# Patient Record
Sex: Male | Born: 1950 | Race: White | Hispanic: No | Smoking: Former smoker
Health system: Southern US, Community
[De-identification: ages and names within clinical notes are randomized; demographics above are authoritative.]

## PROBLEM LIST (undated history)

## (undated) DIAGNOSIS — Z8719 Personal history of other diseases of the digestive system: Secondary | ICD-10-CM

## (undated) DIAGNOSIS — C61 Malignant neoplasm of prostate: Secondary | ICD-10-CM

## (undated) DIAGNOSIS — M1990 Unspecified osteoarthritis, unspecified site: Secondary | ICD-10-CM

## (undated) HISTORY — PX: APPENDECTOMY: SHX54

## (undated) HISTORY — PX: HERNIA REPAIR: SHX51

---

## 2003-08-05 ENCOUNTER — Ambulatory Visit (HOSPITAL_COMMUNITY): Admission: RE | Admit: 2003-08-05 | Discharge: 2003-08-05 | Payer: Self-pay | Admitting: Internal Medicine

## 2003-08-12 ENCOUNTER — Ambulatory Visit (HOSPITAL_COMMUNITY): Admission: RE | Admit: 2003-08-12 | Discharge: 2003-08-12 | Payer: Self-pay | Admitting: Internal Medicine

## 2004-09-21 ENCOUNTER — Ambulatory Visit (HOSPITAL_COMMUNITY): Admission: RE | Admit: 2004-09-21 | Discharge: 2004-09-21 | Payer: Self-pay | Admitting: Internal Medicine

## 2004-11-13 ENCOUNTER — Ambulatory Visit (HOSPITAL_COMMUNITY): Admission: RE | Admit: 2004-11-13 | Discharge: 2004-11-13 | Payer: Self-pay | Admitting: General Surgery

## 2014-01-04 ENCOUNTER — Other Ambulatory Visit (HOSPITAL_COMMUNITY): Payer: Self-pay | Admitting: Surgery

## 2014-01-04 DIAGNOSIS — L0291 Cutaneous abscess, unspecified: Secondary | ICD-10-CM

## 2014-01-05 ENCOUNTER — Ambulatory Visit (HOSPITAL_COMMUNITY)
Admission: RE | Admit: 2014-01-05 | Discharge: 2014-01-05 | Disposition: A | Discharge status: Home / Self Care | Payer: Medicare HMO | Source: Ambulatory Visit | Attending: Interventional Radiology | Admitting: Interventional Radiology

## 2014-01-05 ENCOUNTER — Encounter (HOSPITAL_COMMUNITY): Payer: Self-pay

## 2014-01-05 ENCOUNTER — Ambulatory Visit (HOSPITAL_COMMUNITY)
Admission: RE | Admit: 2014-01-05 | Discharge: 2014-01-05 | Disposition: A | Discharge status: Home / Self Care | Payer: Medicare HMO | Source: Ambulatory Visit | Attending: Surgery | Admitting: Surgery

## 2014-01-05 DIAGNOSIS — R109 Unspecified abdominal pain: Secondary | ICD-10-CM | POA: Diagnosis present

## 2014-01-05 DIAGNOSIS — L02211 Cutaneous abscess of abdominal wall: Secondary | ICD-10-CM | POA: Diagnosis present

## 2014-01-05 DIAGNOSIS — L0291 Cutaneous abscess, unspecified: Secondary | ICD-10-CM

## 2014-01-05 DIAGNOSIS — Z87891 Personal history of nicotine dependence: Secondary | ICD-10-CM | POA: Insufficient documentation

## 2014-01-05 DIAGNOSIS — K651 Peritoneal abscess: Secondary | ICD-10-CM | POA: Diagnosis present

## 2014-01-05 DIAGNOSIS — Z9049 Acquired absence of other specified parts of digestive tract: Secondary | ICD-10-CM | POA: Diagnosis not present

## 2014-01-05 LAB — PROTIME-INR
INR: 1.37 (ref 0.00–1.49)
PROTHROMBIN TIME: 17.1 s — AB (ref 11.6–15.2)

## 2014-01-05 MED ORDER — MIDAZOLAM HCL 2 MG/2ML IJ SOLN
INTRAMUSCULAR | Status: AC | PRN
  Administered 2014-01-05 (×2): 1 mg via INTRAVENOUS

## 2014-01-05 MED ORDER — FENTANYL CITRATE 0.05 MG/ML IJ SOLN
INTRAMUSCULAR | Status: AC
  Filled 2014-01-05: qty 2

## 2014-01-05 MED ORDER — MIDAZOLAM HCL 2 MG/2ML IJ SOLN
INTRAMUSCULAR | Status: AC
  Filled 2014-01-05: qty 2

## 2014-01-05 MED ORDER — LIDOCAINE HCL 1 % IJ SOLN
INTRAMUSCULAR | Status: AC
  Filled 2014-01-05: qty 20

## 2014-01-05 MED ORDER — FENTANYL CITRATE 0.05 MG/ML IJ SOLN
INTRAMUSCULAR | Status: AC | PRN
  Administered 2014-01-05 (×2): 50 ug via INTRAVENOUS

## 2014-01-05 NOTE — Sedation Documentation (Signed)
Patient denies pain and is resting comfortably.  

## 2014-01-05 NOTE — Procedures (Signed)
Interventional Radiology Procedure Note  Procedure: CT guided drainage of right abdominal abscess.  24F drain placed to bulb suction. 125cc purulent fluid aspirated.   Complications: No immediate Recommendations:  - f/u cultures - Routine drain care. - flush with sterile saline BID - record output   Signed,  Dulcy Fanny. Earleen Newport, DO

## 2014-01-05 NOTE — H&P (Signed)
Chief Complaint: abd pain Recent appendectomy 12/28/2013 New abd and pelvic abscesses  Referring Physician(s): Demason,Marc  History of Present Illness: Michael Contreras is a 63 y.o. male   Pt developed N/V and abd pain approx 10 days ago Admitted to [facility] CT scan revealed acute appendicitis with perforation Appendectomy 12/28/13 Now with new abd pain CT reveals RLQ abscess and pelvic abscess Request for IR consult for RLQ abscess and pelvic abscess drain placement Dr Earleen Newport has reviewed imaging I have seen and examined pt Now scheduled for abscess drains  History reviewed. No pertinent past medical history.  Past Surgical History  Procedure Laterality Date  . Appendectomy      Allergies: Review of patient's allergies indicates no known allergies.  Medications: Prior to Admission medications   Not on File    History reviewed. No pertinent family history.  History   Social History  . Marital Status: Legally Separated    Spouse Name: N/A    Number of Children: N/A  . Years of Education: N/A   Social History Main Topics  . Smoking status: Former Smoker    Quit date: 01/05/2013  . Smokeless tobacco: None  . Alcohol Use: None  . Drug Use: None  . Sexual Activity: None   Other Topics Concern  . None   Social History Narrative  . None     Review of Systems: A 12 point ROS discussed and pertinent positives are indicated in the HPI above.  All other systems are negative.  Review of Systems  Constitutional: Positive for fever, activity change and appetite change.  Respiratory: Negative for shortness of breath.   Cardiovascular: Negative for chest pain.  Gastrointestinal: Positive for abdominal pain and abdominal distention.  Psychiatric/Behavioral: Negative for behavioral problems and confusion.    Vital Signs: BP 114/60 mmHg  Pulse 94  Temp(Src) 98.5 F (36.9 C) (Oral)  Resp 18  SpO2 91%  Physical Exam  Cardiovascular: Normal  rate, regular rhythm and normal heart sounds.   Pulmonary/Chest: Effort normal and breath sounds normal. He has no wheezes.  Abdominal: Soft. Bowel sounds are normal. He exhibits distension. There is tenderness.  Musculoskeletal: Normal range of motion.  Neurological: He is alert.  Skin: Skin is warm and dry.  Psychiatric: He has a normal mood and affect. His behavior is normal. Judgment and thought content normal.  Vitals reviewed.   Imaging: No results found.  Labs:  CBC: No results for input(s): WBC, HGB, HCT, PLT in the last 8760 hours.  COAGS:  Recent Labs  01/05/14 1000  INR 1.37    BMP: No results for input(s): NA, K, CL, CO2, GLUCOSE, BUN, CALCIUM, CREATININE, GFRNONAA, GFRAA in the last 8760 hours.  Invalid input(s): CMP  LIVER FUNCTION TESTS: No results for input(s): BILITOT, AST, ALT, ALKPHOS, PROT, ALBUMIN in the last 8760 hours.  TUMOR MARKERS: No results for input(s): AFPTM, CEA, CA199, CHROMGRNA in the last 8760 hours.  Assessment and Plan:  11/3 appendectomy after acute appendicitis with perforation 12/28/13 Now with new abd pain; fever New RLQ and pelvic abscesses Scheduled now for abscess drains to be placed Pt aware of procedure benefits and risks and agreeable to proceed Consent signed and in chart  Thank you for this interesting consult.  I greatly enjoyed meeting Michael Contreras and look forward to participating in their care.     I spent a total of 40 minutes face to face in clinical consultation, greater than 50% of which was counseling/coordinating care  for abd and pelvic abscess drains  Signed: Elisheba Mcdonnell A 01/05/2014, 11:04 AM

## 2014-01-05 NOTE — Sedation Documentation (Signed)
[facility].

## 2014-01-05 NOTE — Procedures (Signed)
Interventional Radiology Procedure Note  Procedure:  CT guided pelvic abscess drainage. 44F drainage.  Findings: Abscess drained via 44F transgluteal drain.   Complications: No immediate Recommendations:  - Flush drain 2 times per day, 72ml sterile saline. - record output - follow up cultures  Signed,  Dulcy Fanny. Earleen Newport, DO

## 2014-01-08 LAB — CULTURE, ROUTINE-ABSCESS

## 2014-01-10 LAB — ANAEROBIC CULTURE

## 2014-01-11 LAB — CULTURE, ROUTINE-ABSCESS

## 2015-06-12 DIAGNOSIS — Z6822 Body mass index (BMI) 22.0-22.9, adult: Secondary | ICD-10-CM | POA: Diagnosis not present

## 2015-06-12 DIAGNOSIS — L42 Pityriasis rosea: Secondary | ICD-10-CM | POA: Diagnosis not present

## 2015-06-14 DIAGNOSIS — Z79899 Other long term (current) drug therapy: Secondary | ICD-10-CM | POA: Diagnosis not present

## 2015-06-14 DIAGNOSIS — Z131 Encounter for screening for diabetes mellitus: Secondary | ICD-10-CM | POA: Diagnosis not present

## 2015-11-02 DIAGNOSIS — K4090 Unilateral inguinal hernia, without obstruction or gangrene, not specified as recurrent: Secondary | ICD-10-CM | POA: Diagnosis not present

## 2015-11-03 DIAGNOSIS — K4090 Unilateral inguinal hernia, without obstruction or gangrene, not specified as recurrent: Secondary | ICD-10-CM | POA: Diagnosis not present

## 2015-11-03 DIAGNOSIS — R339 Retention of urine, unspecified: Secondary | ICD-10-CM | POA: Diagnosis not present

## 2015-11-03 DIAGNOSIS — N9989 Other postprocedural complications and disorders of genitourinary system: Secondary | ICD-10-CM | POA: Diagnosis not present

## 2015-11-03 DIAGNOSIS — R338 Other retention of urine: Secondary | ICD-10-CM | POA: Diagnosis not present

## 2015-11-03 DIAGNOSIS — Z87891 Personal history of nicotine dependence: Secondary | ICD-10-CM | POA: Diagnosis not present

## 2015-11-03 DIAGNOSIS — Z7982 Long term (current) use of aspirin: Secondary | ICD-10-CM | POA: Diagnosis not present

## 2015-11-03 DIAGNOSIS — D176 Benign lipomatous neoplasm of spermatic cord: Secondary | ICD-10-CM | POA: Diagnosis not present

## 2015-11-06 DIAGNOSIS — R339 Retention of urine, unspecified: Secondary | ICD-10-CM | POA: Diagnosis not present

## 2015-11-06 DIAGNOSIS — K4090 Unilateral inguinal hernia, without obstruction or gangrene, not specified as recurrent: Secondary | ICD-10-CM | POA: Diagnosis not present

## 2015-11-14 DIAGNOSIS — R339 Retention of urine, unspecified: Secondary | ICD-10-CM | POA: Diagnosis not present

## 2015-11-14 DIAGNOSIS — R39198 Other difficulties with micturition: Secondary | ICD-10-CM | POA: Diagnosis not present

## 2015-12-18 DIAGNOSIS — R339 Retention of urine, unspecified: Secondary | ICD-10-CM | POA: Diagnosis not present

## 2015-12-18 DIAGNOSIS — R39198 Other difficulties with micturition: Secondary | ICD-10-CM | POA: Diagnosis not present

## 2015-12-26 DIAGNOSIS — R972 Elevated prostate specific antigen [PSA]: Secondary | ICD-10-CM | POA: Diagnosis not present

## 2015-12-26 DIAGNOSIS — N5202 Corporo-venous occlusive erectile dysfunction: Secondary | ICD-10-CM | POA: Diagnosis not present

## 2015-12-26 DIAGNOSIS — R39198 Other difficulties with micturition: Secondary | ICD-10-CM | POA: Diagnosis not present

## 2016-01-31 DIAGNOSIS — R972 Elevated prostate specific antigen [PSA]: Secondary | ICD-10-CM | POA: Diagnosis not present

## 2016-02-06 DIAGNOSIS — R972 Elevated prostate specific antigen [PSA]: Secondary | ICD-10-CM | POA: Diagnosis not present

## 2016-02-06 DIAGNOSIS — R39198 Other difficulties with micturition: Secondary | ICD-10-CM | POA: Diagnosis not present

## 2016-02-06 DIAGNOSIS — N5202 Corporo-venous occlusive erectile dysfunction: Secondary | ICD-10-CM | POA: Diagnosis not present

## 2016-02-15 DIAGNOSIS — Z6824 Body mass index (BMI) 24.0-24.9, adult: Secondary | ICD-10-CM | POA: Diagnosis not present

## 2016-02-15 DIAGNOSIS — K429 Umbilical hernia without obstruction or gangrene: Secondary | ICD-10-CM | POA: Diagnosis not present

## 2016-03-15 DIAGNOSIS — R972 Elevated prostate specific antigen [PSA]: Secondary | ICD-10-CM | POA: Diagnosis not present

## 2016-03-15 DIAGNOSIS — C61 Malignant neoplasm of prostate: Secondary | ICD-10-CM | POA: Diagnosis not present

## 2016-03-28 DIAGNOSIS — C61 Malignant neoplasm of prostate: Secondary | ICD-10-CM | POA: Diagnosis not present

## 2016-04-23 DIAGNOSIS — Z6824 Body mass index (BMI) 24.0-24.9, adult: Secondary | ICD-10-CM | POA: Diagnosis not present

## 2016-04-23 DIAGNOSIS — M13141 Monoarthritis, not elsewhere classified, right hand: Secondary | ICD-10-CM | POA: Diagnosis not present

## 2016-04-23 DIAGNOSIS — Z87891 Personal history of nicotine dependence: Secondary | ICD-10-CM | POA: Diagnosis not present

## 2016-04-23 DIAGNOSIS — J449 Chronic obstructive pulmonary disease, unspecified: Secondary | ICD-10-CM | POA: Diagnosis not present

## 2016-04-23 DIAGNOSIS — Z0000 Encounter for general adult medical examination without abnormal findings: Secondary | ICD-10-CM | POA: Diagnosis not present

## 2016-04-30 DIAGNOSIS — C61 Malignant neoplasm of prostate: Secondary | ICD-10-CM | POA: Diagnosis not present

## 2016-05-02 ENCOUNTER — Other Ambulatory Visit: Payer: Self-pay | Admitting: Urology

## 2016-05-10 ENCOUNTER — Encounter (HOSPITAL_COMMUNITY): Payer: Self-pay

## 2016-05-10 DIAGNOSIS — M6281 Muscle weakness (generalized): Secondary | ICD-10-CM | POA: Diagnosis not present

## 2016-05-10 DIAGNOSIS — R350 Frequency of micturition: Secondary | ICD-10-CM | POA: Diagnosis not present

## 2016-05-10 DIAGNOSIS — M62838 Other muscle spasm: Secondary | ICD-10-CM | POA: Diagnosis not present

## 2016-05-10 DIAGNOSIS — C61 Malignant neoplasm of prostate: Secondary | ICD-10-CM | POA: Diagnosis not present

## 2016-05-10 NOTE — Patient Instructions (Signed)
GAYLORD SEYDEL  05/10/2016   Your procedure is scheduled on: 05-16-16  Report to [facility] Main  Entrance take [facility]  elevators to 3rd floor to  [place] [facility].  Call this number if you have problems the morning of surgery [phone]   Remember: ONLY 1 PERSON MAY GO WITH YOU TO SHORT STAY TO GET  READY MORNING OF [place].  Do not eat food After Midnight. Tuesday night 05-14-16. Drink plenty of clear liquids all day Wednesday 05-15-16 and follow all of Dr Laney Pastor instructions for bowel prep. Do not consume anything by mouth after midnight Wednesday!!     Take these medicines the morning of surgery with A SIP OF WATER: none                                 You may not have any metal on your body including hair pins and              piercings  Do not wear jewelry, make-up, lotions, powders or perfumes, deodorant             Do not wear nail polish.  Do not shave  48 hours prior to surgery.              Men may shave face and neck.   Do not bring valuables to the hospital. [place].  Contacts, dentures or bridgework may not be worn into surgery.  Leave suitcase in the car. After surgery it may be brought to your room.              Please read over the following fact sheets you were given: _____________________________________________________________________     CLEAR LIQUID DIET   Foods Allowed                                                                     Foods Excluded  Coffee and tea, regular and decaf                             liquids that you cannot  Plain Jell-O in any flavor                                             see through such as: Fruit ices (not with fruit pulp)                                     milk, soups, orange juice  Iced Popsicles                                    All solid food Carbonated beverages, regular and  diet                                     Cranberry, grape and apple juices Sports drinks like Gatorade Lightly seasoned clear broth or consume(fat free) Sugar, honey syrup  Sample Menu Breakfast                                Lunch                                     Supper Cranberry juice                    Beef broth                            Chicken broth Jell-O                                     Grape juice                           Apple juice Coffee or tea                        Jell-O                                      Popsicle                                                Coffee or tea                        Coffee or tea  _____________________________________________________________________  [facility] - Preparing for Surgery Before surgery, you can play an important role.  Because skin is not sterile, your skin needs to be as free of germs as possible.  You can reduce the number of germs on your skin by washing with CHG (chlorahexidine gluconate) soap before surgery.  CHG is an antiseptic cleaner which kills germs and bonds with the skin to continue killing germs even after washing. Please DO NOT use if you have an allergy to CHG or antibacterial soaps.  If your skin becomes reddened/irritated stop using the CHG and inform your nurse when you arrive at Short Stay. Do not shave (including legs and underarms) for at least 48 hours prior to the first CHG shower.  You may shave your face/neck. Please follow these instructions carefully:  1.  Shower with CHG Soap the night before surgery and the  morning of Surgery.  2.  If you choose to wash your hair, wash your hair first as usual with your  normal  shampoo.  3.  After you shampoo, rinse your hair and body thoroughly to remove the  shampoo.  4.  Use CHG as you would any other liquid soap.  You can apply chg directly  to the skin and wash                       Gently with a scrungie or clean washcloth.  5.  Apply the CHG Soap to your body ONLY  FROM THE NECK DOWN.   Do not use on face/ open                           Wound or open sores. Avoid contact with eyes, ears mouth and genitals (private parts).                       Wash face,  Genitals (private parts) with your normal soap.             6.  Wash thoroughly, paying special attention to the area where your surgery  will be performed.  7.  Thoroughly rinse your body with warm water from the neck down.  8.  DO NOT shower/wash with your normal soap after using and rinsing off  the CHG Soap.                9.  Pat yourself dry with a clean towel.            10.  Wear clean pajamas.            11.  Place clean sheets on your bed the night of your first shower and do not  sleep with pets. Day of Surgery : Do not apply any lotions/deodorants the morning of surgery.  Please wear clean clothes to the hospital/surgery center.  FAILURE TO FOLLOW THESE INSTRUCTIONS MAY RESULT IN THE CANCELLATION OF YOUR SURGERY PATIENT SIGNATURE_________________________________  NURSE SIGNATURE__________________________________  ________________________________________________________________________   Adam Phenix  An incentive spirometer is a tool that can help keep your lungs clear and active. This tool measures how well you are filling your lungs with each breath. Taking long deep breaths may help reverse or decrease the chance of developing breathing (pulmonary) problems (especially infection) following:  A long period of time when you are unable to move or be active. BEFORE THE PROCEDURE   If the spirometer includes an indicator to show your best effort, your nurse or respiratory therapist will set it to a desired goal.  If possible, sit up straight or lean slightly forward. Try not to slouch.  Hold the incentive spirometer in an upright position. INSTRUCTIONS FOR USE  1. Sit on the edge of your bed if possible, or sit up as far as you can in bed or on a chair. 2. Hold the incentive  spirometer in an upright position. 3. Breathe out normally. 4. Place the mouthpiece in your mouth and seal your lips tightly around it. 5. Breathe in slowly and as deeply as possible, raising the piston or the ball toward the top of the column. 6. Hold your breath for 3-5 seconds or for as long as possible. Allow the piston or ball to fall to the bottom of the column. 7. Remove the mouthpiece from your mouth and breathe out normally. 8. Rest for a few seconds and repeat Steps 1 through 7 at least 10 times every 1-2 hours when you are awake. Take your time and take a few normal breaths between deep breaths. 9. The spirometer may include an indicator to  show your best effort. Use the indicator as a goal to work toward during each repetition. 10. After each set of 10 deep breaths, practice coughing to be sure your lungs are clear. If you have an incision (the cut made at the time of surgery), support your incision when coughing by placing a pillow or rolled up towels firmly against it. Once you are able to get out of bed, walk around indoors and cough well. You may stop using the incentive spirometer when instructed by your caregiver.  RISKS AND COMPLICATIONS  Take your time so you do not get dizzy or light-headed.  If you are in pain, you may need to take or ask for pain medication before doing incentive spirometry. It is harder to take a deep breath if you are having pain. AFTER USE  Rest and breathe slowly and easily.  It can be helpful to keep track of a log of your progress. Your caregiver can provide you with a simple table to help with this. If you are using the spirometer at home, follow these instructions: [place] IF:   You are having difficultly using the spirometer.  You have trouble using the spirometer as often as instructed.  Your pain medication is not giving enough relief while using the spirometer.  You develop fever of 100.5 F (38.1 C) or higher. SEEK  IMMEDIATE MEDICAL CARE IF:   You cough up bloody sputum that had not been present before.  You develop fever of 102 F (38.9 C) or greater.  You develop worsening pain at or near the incision site. MAKE SURE YOU:   Understand these instructions.  Will watch your condition.  Will get help right away if you are not doing well or get worse. Document Released: 06/24/2006 Document Revised: 05/06/2011 Document Reviewed: 08/25/2006 Exit[employer] Patient Information 2014 [employer], [place].   ________________________________________________________________________  WHAT IS A BLOOD TRANSFUSION? Blood Transfusion Information  A transfusion is the replacement of blood or some of its parts. Blood is made up of multiple cells which provide different functions.  Red blood cells carry oxygen and are used for blood loss replacement.  White blood cells fight against infection.  Platelets control bleeding.  Plasma helps clot blood.  Other blood products are available for specialized needs, such as hemophilia or other clotting disorders. BEFORE THE TRANSFUSION  Who gives blood for transfusions?   Healthy volunteers who are fully evaluated to make sure their blood is safe. This is blood bank blood. Transfusion therapy is the safest it has ever been in the practice of medicine. Before blood is taken from a donor, a complete history is taken to make sure that person has no history of diseases nor engages in risky social behavior (examples are intravenous drug use or sexual activity with multiple partners). The donor's travel history is screened to minimize risk of transmitting infections, such as malaria. The donated blood is tested for signs of infectious diseases, such as HIV and hepatitis. The blood is then tested to be sure it is compatible with you in order to minimize the chance of a transfusion reaction. If you or a relative donates blood, this is often done in anticipation of surgery and is not  appropriate for emergency situations. It takes many days to process the donated blood. RISKS AND COMPLICATIONS Although transfusion therapy is very safe and saves many lives, the main dangers of transfusion include:   Getting an infectious disease.  Developing a transfusion reaction. This is an allergic reaction  to something in the blood you were given. Every precaution is taken to prevent this. The decision to have a blood transfusion has been considered carefully by your caregiver before blood is given. Blood is not given unless the benefits outweigh the risks. AFTER THE TRANSFUSION  Right after receiving a blood transfusion, you will usually feel much better and more energetic. This is especially true if your red blood cells have gotten low (anemic). The transfusion raises the level of the red blood cells which carry oxygen, and this usually causes an energy increase.  The nurse administering the transfusion will monitor you carefully for complications. HOME CARE INSTRUCTIONS  No special instructions are needed after a transfusion. You may find your energy is better. Speak with your caregiver about any limitations on activity for underlying diseases you may have. SEEK MEDICAL CARE IF:   Your condition is not improving after your transfusion.  You develop redness or irritation at the intravenous (IV) site. SEEK IMMEDIATE MEDICAL CARE IF:  Any of the following symptoms occur over the next 12 hours:  Shaking chills.  You have a temperature by mouth above 102 F (38.9 C), not controlled by medicine.  Chest, back, or muscle pain.  People around you feel you are not acting correctly or are confused.  Shortness of breath or difficulty breathing.  Dizziness and fainting.  You get a rash or develop hives.  You have a decrease in urine output.  Your urine turns a dark color or changes to pink, red, or brown. Any of the following symptoms occur over the next 10 days:  You have a  temperature by mouth above 102 F (38.9 C), not controlled by medicine.  Shortness of breath.  Weakness after normal activity.  The white part of the eye turns yellow (jaundice).  You have a decrease in the amount of urine or are urinating less often.  Your urine turns a dark color or changes to pink, red, or brown. Document Released: 02/09/2000 Document Revised: 05/06/2011 Document Reviewed: 09/28/2007 [facility] Patient Information 2014 [place], [place].  _______________________________________________________________________

## 2016-05-14 ENCOUNTER — Ambulatory Visit (HOSPITAL_COMMUNITY)
Admission: RE | Admit: 2016-05-14 | Discharge: 2016-05-14 | Disposition: A | Discharge status: Home / Self Care | Payer: Medicare HMO | Source: Ambulatory Visit | Attending: Urology | Admitting: Urology

## 2016-05-14 ENCOUNTER — Encounter (INDEPENDENT_AMBULATORY_CARE_PROVIDER_SITE_OTHER): Payer: Self-pay

## 2016-05-14 ENCOUNTER — Encounter (HOSPITAL_COMMUNITY): Payer: Self-pay

## 2016-05-14 ENCOUNTER — Encounter (HOSPITAL_COMMUNITY)
Admission: RE | Admit: 2016-05-14 | Discharge: 2016-05-14 | Disposition: A | Discharge status: Home / Self Care | Payer: Medicare HMO | Source: Ambulatory Visit | Attending: Urology | Admitting: Urology

## 2016-05-14 DIAGNOSIS — R9389 Abnormal findings on diagnostic imaging of other specified body structures: Secondary | ICD-10-CM

## 2016-05-14 DIAGNOSIS — Z9889 Other specified postprocedural states: Secondary | ICD-10-CM | POA: Insufficient documentation

## 2016-05-14 DIAGNOSIS — Z01812 Encounter for preprocedural laboratory examination: Secondary | ICD-10-CM | POA: Diagnosis not present

## 2016-05-14 DIAGNOSIS — Z01818 Encounter for other preprocedural examination: Secondary | ICD-10-CM | POA: Diagnosis not present

## 2016-05-14 DIAGNOSIS — R918 Other nonspecific abnormal finding of lung field: Secondary | ICD-10-CM | POA: Diagnosis not present

## 2016-05-14 DIAGNOSIS — J982 Interstitial emphysema: Secondary | ICD-10-CM | POA: Diagnosis not present

## 2016-05-14 HISTORY — DX: Malignant neoplasm of prostate: C61

## 2016-05-14 HISTORY — DX: Unspecified osteoarthritis, unspecified site: M19.90

## 2016-05-14 HISTORY — DX: Personal history of other diseases of the digestive system: Z87.19

## 2016-05-14 LAB — CBC
HEMATOCRIT: 39 % (ref 39.0–52.0)
Hemoglobin: 12.5 g/dL — ABNORMAL LOW (ref 13.0–17.0)
MCH: 27.7 pg (ref 26.0–34.0)
MCHC: 32.1 g/dL (ref 30.0–36.0)
MCV: 86.3 fL (ref 78.0–100.0)
Platelets: 244 10*3/uL (ref 150–400)
RBC: 4.52 MIL/uL (ref 4.22–5.81)
RDW: 15.6 % — AB (ref 11.5–15.5)
WBC: 5.2 10*3/uL (ref 4.0–10.5)

## 2016-05-14 LAB — ABO/RH: ABO/RH(D): A POS

## 2016-05-14 LAB — BASIC METABOLIC PANEL
ANION GAP: 6 (ref 5–15)
BUN: 16 mg/dL (ref 6–20)
CO2: 29 mmol/L (ref 22–32)
CREATININE: 0.74 mg/dL (ref 0.61–1.24)
Calcium: 9.5 mg/dL (ref 8.9–10.3)
Chloride: 105 mmol/L (ref 101–111)
GFR calc non Af Amer: >60 mL/min (ref >60)
Glucose, Bld: 95 mg/dL (ref 65–99)
Potassium: 4.3 mmol/L (ref 3.5–5.1)
Sodium: 140 mmol/L (ref 135–145)

## 2016-05-15 NOTE — H&P (Signed)
Office Visit Report     04/30/2016   --------------------------------------------------------------------------------   Michael Contreras  MRN: [id number]  PRIMARY CARE:  Michael L. Exie Parody, MD  DOB: 10/12/1950, 66 year old Male  REFERRING:  Michael Contreras  SSN: [id number]  PROVIDER:  Raynelle Contreras, M.D.    LOCATION:  [facility] [phone]   --------------------------------------------------------------------------------   CC/HPI: CC: Prostate Cancer   Physician requesting consult: Dr. Clyde Contreras  PCP: Dr. Stoney Contreras   Michael Contreras is a 66 year old who was found to have an elevated PSA of 5.2 prompting a TRUS biopsy of the prostate on 03/15/16. This demonstrated Gleason 4+3=7 adenocarcinoma of the prostate with 7 out of 16 biopsy cores positive for malignancy.   Family history: None.   Imaging studies: None.   PMH: He has no major medical problems currently. He does have a history of bilateral pneumothoraces in the 1970s that occurred spontaneously.  PSH: Laparoscopic appendectomy for perforated appendix in 2014, left inguinal hernia repair in 2017.   TNM stage: cT2a Nx Mx  PSA: 5.2  Gleason score: 4+3=7  Biopsy (03/15/16): 7/16 cores positive  Left: Benign  Right: R apex (2/2 cores, 50%, 50%, 4+3=7), R mid (3/3 cores, 75%, 50%, 25%, 4+3=7, PNI), R base (2/3 cores, 50%, 50%, 3+4=7)  Prostate volume: 20.4 cc   Nomogram  OC disease: 34%  EPE: 63%  SVI: 7%  LNI: 8%  PFS (5 year, 10 year): 70%,55%   Urinary function: IPSS is 11.  Erectile function: SHIM score is 2. He is single and not currently sexually active.     ALLERGIES: None   MEDICATIONS: Adult Low Dose Aspirin Ec 81 mg tablet, delayed release     GU PSH: None   NON-GU PSH: Appendectomy (laparoscopic) Hernia Repair Lung Surgery (Unspecified)    GU PMH: Prostate Cancer    NON-GU PMH: Primary spontaneous pneumothorax    FAMILY HISTORY: 2 sons - Other copd - Mother Death of family member  - Father, Mother [facility] Spotted Fever - Father   SOCIAL HISTORY: Marital Status: Single Current Smoking Status: Patient does not smoke anymore. Has not smoked since 04/26/2011. Smoked for 50 years. Smoked 2 packs per day.   Tobacco Use Assessment Completed: Used Tobacco in last 30 days? Has never drank.  Drinks 2 caffeinated drinks per day. Patient's occupation Management consultant.    REVIEW OF SYSTEMS:    GU Review Male:   Patient reports frequent urination and get up at night to urinate. Patient denies hard to postpone urination, burning/ pain with urination, leakage of urine, stream starts and stops, trouble starting your streams, and have to strain to urinate .  Gastrointestinal (Upper):   Patient denies nausea and vomiting.  Gastrointestinal (Lower):   Patient denies diarrhea and constipation.  Constitutional:   Patient denies fever, night sweats, weight loss, and fatigue.  Skin:   Patient denies skin rash/ lesion and itching.  Eyes:   Patient denies blurred vision and double vision.  Ears/ Nose/ Throat:   Patient denies sore throat and sinus problems.  Hematologic/Lymphatic:   Patient denies swollen glands and easy bruising.  Cardiovascular:   Patient denies leg swelling and chest pains.  Respiratory:   Patient denies cough and shortness of breath.  Endocrine:   Patient denies excessive thirst.  Musculoskeletal:   Patient denies back pain and joint pain.  Neurological:   Patient denies headaches and dizziness.  Psychologic:   Patient denies depression and anxiety.  VITAL SIGNS:      04/30/2016 09:36 AM  Weight 194 lb / 88 kg  Height 75 in / 190.5 cm  BP 105/58 mmHg  Pulse 51 /min  BMI 24.2 kg/m   GU PHYSICAL EXAMINATION:    Prostate: 25 cc. He does have induration along the right base and mid gland with no definite extraprostatic extension.   MULTI-SYSTEM PHYSICAL EXAMINATION:    Constitutional: Well-nourished. No physical deformities. Normally developed. Good  grooming.  Neck: Neck symmetrical, not swollen. Normal tracheal position.  Respiratory: No labored breathing, no use of accessory muscles. Clear bilaterally.  Cardiovascular: Normal temperature, normal extremity pulses, no swelling, no varicosities. Regular rate and rhythm.  Lymphatic: No enlargement of neck, axillae, groin.  Skin: No paleness, no jaundice, no cyanosis. No lesion, no ulcer, no rash.  Neurologic / Psychiatric: Oriented to time, oriented to place, oriented to person. No depression, no anxiety, no agitation.  Gastrointestinal: No mass, no tenderness, no rigidity, non obese abdomen.  Eyes: Normal conjunctivae. Normal eyelids.  Ears, Nose, Mouth, and Throat: Left ear no scars, no lesions, no masses. Right ear no scars, no lesions, no masses. Nose no scars, no lesions, no masses. Normal hearing. Normal lips.  Musculoskeletal: Normal gait and station of head and neck.     PAST DATA REVIEWED:  Source Of History:  Patient  Lab Test Review:   PSA  Records Review:   Pathology Reports, Previous Patient Records  Urine Test Review:   Urinalysis   PROCEDURES:          Urinalysis Dipstick Dipstick Cont'd  Color: Yellow Bilirubin: Neg  Appearance: Clear Ketones: Neg  Specific Gravity: 1.015 Blood: Neg  pH: 6.0 Protein: Neg  Glucose: Neg Urobilinogen: 0.2    Nitrites: Neg    Leukocyte Esterase: Neg    ASSESSMENT:      ICD-10 Details  1 GU:   Prostate Cancer - C61    PLAN:           Schedule Return Visit/Planned Activity: Next Available Appointment - PT/OT Referral             Note: Please schedule patient for preoperative PT prior to radical prostatectomy.    Return Visit/Planned Activity: Other See Visit Notes             Note: Will call to schedule surgery          Document Letter(s):  Created for Patient: Clinical Summary         Notes:   1. Prostate cancer: I had a detailed discussion with Michael Contreras regarding options for management of his prostate cancer. I did  recommend therapy of curative intent considering his life expectancy and diseased parameters.   The patient was counseled about the natural history of prostate cancer and the standard treatment options that are available for prostate cancer. It was explained to him how his age and life expectancy, clinical stage, Gleason score, and PSA affect his prognosis, the decision to proceed with additional staging studies, as well as how that information influences recommended treatment strategies. We discussed the roles for active surveillance, radiation therapy, surgical therapy, androgen deprivation, as well as ablative therapy options for the treatment of prostate cancer as appropriate to his individual cancer situation. We discussed the risks and benefits of these options with regard to their impact on cancer control and also in terms of potential adverse events, complications, and impact on quality of life particularly related to urinary and sexual function. The patient  was encouraged to ask questions throughout the discussion today and all questions were answered to his stated satisfaction. In addition, the patient was provided with and/or directed to appropriate resources and literature for further education about prostate cancer and treatment options.   We discussed surgical therapy for prostate cancer including the different available surgical approaches. We discussed, in detail, the risks and expectations of surgery with regard to cancer control, urinary control, and erectile function as well as the expected postoperative recovery process. Additional risks of surgery including but not limited to bleeding, infection, hernia formation, nerve damage, lymphocele formation, bowel/rectal injury potentially necessitating colostomy, damage to the urinary tract resulting in urine leakage, urethral stricture, and the cardiopulmonary risks such as myocardial infarction, stroke, death, venothromboembolism, etc. were  explained. The risk of open surgical conversion for robotic/laparoscopic prostatectomy was also discussed.   He does wish to proceed with surgical treatment after reviewing options of both primary surgical therapy and primary radiation therapy 6 months of androgen deprivation as appropriate option for therapy.   He will be scheduled for a unilateral left nerve sparing robot-assisted laparoscopic radical prostatectomy and bilateral pelvic lymphadenectomy.   Cc: Dr. Stoney Contreras  Dr. Clyde Contreras         E & M CODE: I spent at least 45 minutes face to face with the patient, more than 50% of that time was spent on counseling and/or coordinating care.     * Signed by Michael Contreras, M.D. on 04/30/16 at 1:10 PM (EST)*

## 2016-05-16 ENCOUNTER — Ambulatory Visit (HOSPITAL_COMMUNITY): Payer: Medicare HMO | Admitting: Certified Registered Nurse Anesthetist

## 2016-05-16 ENCOUNTER — Encounter (HOSPITAL_COMMUNITY): Payer: Self-pay | Admitting: *Deleted

## 2016-05-16 ENCOUNTER — Observation Stay (HOSPITAL_COMMUNITY)
Admission: RE | Admit: 2016-05-16 | Discharge: 2016-05-17 | Disposition: A | Discharge status: Home / Self Care | Payer: Medicare HMO | Source: Ambulatory Visit | Attending: Urology | Admitting: Urology

## 2016-05-16 ENCOUNTER — Encounter (HOSPITAL_COMMUNITY)
Admission: RE | Disposition: A | Discharge status: Home / Self Care | Payer: Self-pay | Source: Ambulatory Visit | Attending: Urology

## 2016-05-16 DIAGNOSIS — L0291 Cutaneous abscess, unspecified: Secondary | ICD-10-CM | POA: Diagnosis not present

## 2016-05-16 DIAGNOSIS — Z87891 Personal history of nicotine dependence: Secondary | ICD-10-CM | POA: Diagnosis not present

## 2016-05-16 DIAGNOSIS — C61 Malignant neoplasm of prostate: Principal | ICD-10-CM | POA: Insufficient documentation

## 2016-05-16 DIAGNOSIS — M1990 Unspecified osteoarthritis, unspecified site: Secondary | ICD-10-CM | POA: Diagnosis not present

## 2016-05-16 DIAGNOSIS — Z7982 Long term (current) use of aspirin: Secondary | ICD-10-CM | POA: Insufficient documentation

## 2016-05-16 HISTORY — PX: LYMPHADENECTOMY: SHX5960

## 2016-05-16 HISTORY — PX: ROBOT ASSISTED LAPAROSCOPIC RADICAL PROSTATECTOMY: SHX5141

## 2016-05-16 LAB — HEMOGLOBIN AND HEMATOCRIT, BLOOD
HEMATOCRIT: 35.8 % — AB (ref 39.0–52.0)
HEMOGLOBIN: 11.4 g/dL — AB (ref 13.0–17.0)

## 2016-05-16 LAB — TYPE AND SCREEN
ABO/RH(D): A POS
Antibody Screen: NEGATIVE

## 2016-05-16 SURGERY — XI ROBOTIC ASSISTED LAPAROSCOPIC RADICAL PROSTATECTOMY LEVEL 2
Anesthesia: General

## 2016-05-16 MED ORDER — CEFAZOLIN SODIUM-DEXTROSE 2-4 GM/100ML-% IV SOLN
INTRAVENOUS | Status: AC
  Filled 2016-05-16: qty 100

## 2016-05-16 MED ORDER — HYDROCODONE-ACETAMINOPHEN 5-325 MG PO TABS: 1.0000 | ORAL_TABLET | Freq: Four times a day (QID) | ORAL | 0 refills | Status: DC | PRN

## 2016-05-16 MED ORDER — ONDANSETRON HCL 4 MG/2ML IJ SOLN
INTRAMUSCULAR | Status: DC | PRN
  Administered 2016-05-16: 4 mg via INTRAVENOUS

## 2016-05-16 MED ORDER — FENTANYL CITRATE (PF) 250 MCG/5ML IJ SOLN
INTRAMUSCULAR | Status: AC
  Filled 2016-05-16: qty 5

## 2016-05-16 MED ORDER — ROCURONIUM BROMIDE 50 MG/5ML IV SOSY
PREFILLED_SYRINGE | INTRAVENOUS | Status: AC
  Filled 2016-05-16: qty 5

## 2016-05-16 MED ORDER — DEXAMETHASONE SODIUM PHOSPHATE 10 MG/ML IJ SOLN
INTRAMUSCULAR | Status: AC
  Filled 2016-05-16: qty 1

## 2016-05-16 MED ORDER — MORPHINE SULFATE (PF) 4 MG/ML IV SOLN
2.0000 mg | INTRAVENOUS | Status: DC | PRN
  Administered 2016-05-16: 4 mg via INTRAVENOUS
  Filled 2016-05-16: qty 1

## 2016-05-16 MED ORDER — EPHEDRINE SULFATE 50 MG/ML IJ SOLN
INTRAMUSCULAR | Status: DC | PRN
  Administered 2016-05-16: 10 mg via INTRAVENOUS

## 2016-05-16 MED ORDER — SUCCINYLCHOLINE CHLORIDE 20 MG/ML IJ SOLN
INTRAMUSCULAR | Status: DC | PRN
  Administered 2016-05-16: 140 mg via INTRAVENOUS

## 2016-05-16 MED ORDER — MIDAZOLAM HCL 5 MG/5ML IJ SOLN
INTRAMUSCULAR | Status: DC | PRN
  Administered 2016-05-16: 2 mg via INTRAVENOUS

## 2016-05-16 MED ORDER — FENTANYL CITRATE (PF) 100 MCG/2ML IJ SOLN
INTRAMUSCULAR | Status: DC | PRN
  Administered 2016-05-16 (×4): 50 ug via INTRAVENOUS
  Administered 2016-05-16: 100 ug via INTRAVENOUS

## 2016-05-16 MED ORDER — CEFAZOLIN SODIUM-DEXTROSE 2-4 GM/100ML-% IV SOLN
2.0000 g | INTRAVENOUS | Status: AC
  Administered 2016-05-16: 2 g via INTRAVENOUS

## 2016-05-16 MED ORDER — DEXAMETHASONE SODIUM PHOSPHATE 10 MG/ML IJ SOLN
INTRAMUSCULAR | Status: DC | PRN
  Administered 2016-05-16: 10 mg via INTRAVENOUS

## 2016-05-16 MED ORDER — DOCUSATE SODIUM 100 MG PO CAPS
100.0000 mg | ORAL_CAPSULE | Freq: Two times a day (BID) | ORAL | Status: DC
  Administered 2016-05-16 – 2016-05-17 (×2): 100 mg via ORAL
  Filled 2016-05-16 (×2): qty 1

## 2016-05-16 MED ORDER — ROCURONIUM BROMIDE 100 MG/10ML IV SOLN
INTRAVENOUS | Status: DC | PRN
  Administered 2016-05-16: 10 mg via INTRAVENOUS
  Administered 2016-05-16: 50 mg via INTRAVENOUS
  Administered 2016-05-16: 10 mg via INTRAVENOUS

## 2016-05-16 MED ORDER — MIDAZOLAM HCL 2 MG/2ML IJ SOLN
INTRAMUSCULAR | Status: AC
  Filled 2016-05-16: qty 2

## 2016-05-16 MED ORDER — BUPIVACAINE-EPINEPHRINE 0.25% -1:200000 IJ SOLN
INTRAMUSCULAR | Status: DC | PRN
  Administered 2016-05-16: 35 mL

## 2016-05-16 MED ORDER — ACETAMINOPHEN 325 MG PO TABS: 650.0000 mg | ORAL_TABLET | ORAL | Status: DC | PRN

## 2016-05-16 MED ORDER — GLYCOPYRROLATE 0.2 MG/ML IJ SOLN
INTRAMUSCULAR | Status: DC | PRN
  Administered 2016-05-16: 0.2 mg via INTRAVENOUS

## 2016-05-16 MED ORDER — BUPIVACAINE-EPINEPHRINE 0.25% -1:200000 IJ SOLN
INTRAMUSCULAR | Status: AC
  Filled 2016-05-16: qty 1

## 2016-05-16 MED ORDER — PROPOFOL 10 MG/ML IV BOLUS
INTRAVENOUS | Status: DC | PRN
  Administered 2016-05-16: 150 mg via INTRAVENOUS

## 2016-05-16 MED ORDER — GLYCOPYRROLATE 0.2 MG/ML IV SOSY
PREFILLED_SYRINGE | INTRAVENOUS | Status: AC
  Filled 2016-05-16: qty 5

## 2016-05-16 MED ORDER — LACTATED RINGERS IV SOLN
INTRAVENOUS | Status: DC
  Administered 2016-05-16: 1000 mL via INTRAVENOUS

## 2016-05-16 MED ORDER — SODIUM CHLORIDE 0.9 % IR SOLN
Status: DC | PRN
  Administered 2016-05-16: 1000 mL via INTRAVESICAL

## 2016-05-16 MED ORDER — HEPARIN SODIUM (PORCINE) 1000 UNIT/ML IJ SOLN
INTRAMUSCULAR | Status: AC
  Filled 2016-05-16: qty 1

## 2016-05-16 MED ORDER — PROPOFOL 10 MG/ML IV BOLUS
INTRAVENOUS | Status: AC
  Filled 2016-05-16: qty 20

## 2016-05-16 MED ORDER — SULFAMETHOXAZOLE-TRIMETHOPRIM 800-160 MG PO TABS: 1.0000 | ORAL_TABLET | Freq: Two times a day (BID) | ORAL | 0 refills | Status: DC

## 2016-05-16 MED ORDER — DIPHENHYDRAMINE HCL 50 MG/ML IJ SOLN: 12.5000 mg | Freq: Four times a day (QID) | INTRAMUSCULAR | Status: DC | PRN

## 2016-05-16 MED ORDER — SUGAMMADEX SODIUM 200 MG/2ML IV SOLN
INTRAVENOUS | Status: AC
  Filled 2016-05-16: qty 2

## 2016-05-16 MED ORDER — SUCCINYLCHOLINE CHLORIDE 200 MG/10ML IV SOSY
PREFILLED_SYRINGE | INTRAVENOUS | Status: AC
  Filled 2016-05-16: qty 10

## 2016-05-16 MED ORDER — SUGAMMADEX SODIUM 200 MG/2ML IV SOLN
INTRAVENOUS | Status: DC | PRN
  Administered 2016-05-16: 200 mg via INTRAVENOUS

## 2016-05-16 MED ORDER — LIDOCAINE 2% (20 MG/ML) 5 ML SYRINGE
INTRAMUSCULAR | Status: AC
  Filled 2016-05-16: qty 5

## 2016-05-16 MED ORDER — OXYCODONE HCL 5 MG/5ML PO SOLN
5.0000 mg | Freq: Once | ORAL | Status: DC | PRN
  Filled 2016-05-16: qty 5

## 2016-05-16 MED ORDER — KETOROLAC TROMETHAMINE 15 MG/ML IJ SOLN
15.0000 mg | Freq: Four times a day (QID) | INTRAMUSCULAR | Status: DC
  Administered 2016-05-16 – 2016-05-17 (×5): 15 mg via INTRAVENOUS
  Filled 2016-05-16 (×5): qty 1

## 2016-05-16 MED ORDER — FENTANYL CITRATE (PF) 100 MCG/2ML IJ SOLN
INTRAMUSCULAR | Status: AC
  Filled 2016-05-16: qty 2

## 2016-05-16 MED ORDER — LACTATED RINGERS IV SOLN
INTRAVENOUS | Status: DC | PRN
  Administered 2016-05-16: 1000 mL
  Administered 2016-05-16: 07:00:00 via INTRAVENOUS

## 2016-05-16 MED ORDER — KCL IN DEXTROSE-NACL 20-5-0.45 MEQ/L-%-% IV SOLN
INTRAVENOUS | Status: DC
  Administered 2016-05-16 – 2016-05-17 (×3): via INTRAVENOUS
  Filled 2016-05-16 (×4): qty 1000

## 2016-05-16 MED ORDER — HYDROMORPHONE HCL 2 MG/ML IJ SOLN
INTRAMUSCULAR | Status: AC
  Filled 2016-05-16: qty 1

## 2016-05-16 MED ORDER — LIDOCAINE HCL (CARDIAC) 20 MG/ML IV SOLN
INTRAVENOUS | Status: DC | PRN
  Administered 2016-05-16: 60 mg via INTRAVENOUS

## 2016-05-16 MED ORDER — DIPHENHYDRAMINE HCL 12.5 MG/5ML PO ELIX: 12.5000 mg | ORAL_SOLUTION | Freq: Four times a day (QID) | ORAL | Status: DC | PRN

## 2016-05-16 MED ORDER — HYDROMORPHONE HCL 1 MG/ML IJ SOLN
0.2500 mg | INTRAMUSCULAR | Status: DC | PRN
  Filled 2016-05-16: qty 0.5

## 2016-05-16 MED ORDER — CEFAZOLIN IN D5W 1 GM/50ML IV SOLN
1.0000 g | Freq: Three times a day (TID) | INTRAVENOUS | Status: AC
  Administered 2016-05-16 – 2016-05-17 (×2): 1 g via INTRAVENOUS
  Filled 2016-05-16 (×2): qty 50

## 2016-05-16 MED ORDER — SODIUM CHLORIDE 0.9 % IV BOLUS (SEPSIS)
1000.0000 mL | Freq: Once | INTRAVENOUS | Status: AC
  Administered 2016-05-16: 1000 mL via INTRAVENOUS

## 2016-05-16 MED ORDER — LACTATED RINGERS IV SOLN
INTRAVENOUS | Status: DC | PRN
  Administered 2016-05-16: 08:00:00

## 2016-05-16 MED ORDER — ONDANSETRON HCL 4 MG/2ML IJ SOLN
INTRAMUSCULAR | Status: AC
  Filled 2016-05-16: qty 2

## 2016-05-16 MED ORDER — EPHEDRINE 5 MG/ML INJ
INTRAVENOUS | Status: AC
  Filled 2016-05-16: qty 10

## 2016-05-16 MED ORDER — STERILE WATER FOR IRRIGATION IR SOLN
Status: DC | PRN
  Administered 2016-05-16: 1000 mL

## 2016-05-16 MED ORDER — ONDANSETRON HCL 4 MG/2ML IJ SOLN: 4.0000 mg | Freq: Four times a day (QID) | INTRAMUSCULAR | Status: DC | PRN

## 2016-05-16 MED ORDER — OXYCODONE HCL 5 MG PO TABS: 5.0000 mg | ORAL_TABLET | Freq: Once | ORAL | Status: DC | PRN

## 2016-05-16 SURGICAL SUPPLY — 54 items
APPLICATOR COTTON TIP 6IN STRL (MISCELLANEOUS) ×3 IMPLANT
CATH FOLEY 2WAY SLVR 18FR 30CC (CATHETERS) ×3 IMPLANT
CATH ROBINSON RED A/P 16FR (CATHETERS) ×3 IMPLANT
CATH ROBINSON RED A/P 8FR (CATHETERS) ×3 IMPLANT
CATH TIEMANN FOLEY 18FR 5CC (CATHETERS) ×3 IMPLANT
CHLORAPREP W/TINT 26ML (MISCELLANEOUS) ×3 IMPLANT
CLIP LIGATING HEM O LOK PURPLE (MISCELLANEOUS) ×6 IMPLANT
COVER SURGICAL LIGHT HANDLE (MISCELLANEOUS) ×3 IMPLANT
COVER TIP SHEARS 8 DVNC (MISCELLANEOUS) ×2 IMPLANT
COVER TIP SHEARS 8MM DA VINCI (MISCELLANEOUS) ×1
CUTTER ECHEON FLEX ENDO 45 340 (ENDOMECHANICALS) ×3 IMPLANT
DECANTER SPIKE VIAL GLASS SM (MISCELLANEOUS) ×3 IMPLANT
DERMABOND ADVANCED (GAUZE/BANDAGES/DRESSINGS) ×1
DERMABOND ADVANCED .7 DNX12 (GAUZE/BANDAGES/DRESSINGS) ×2 IMPLANT
DRAPE ARM DVNC X/XI (DISPOSABLE) ×8 IMPLANT
DRAPE COLUMN DVNC XI (DISPOSABLE) ×2 IMPLANT
DRAPE DA VINCI XI ARM (DISPOSABLE) ×4
DRAPE DA VINCI XI COLUMN (DISPOSABLE) ×1
DRAPE SURG IRRIG POUCH 19X23 (DRAPES) ×3 IMPLANT
DRSG TEGADERM 4X4.75 (GAUZE/BANDAGES/DRESSINGS) ×3 IMPLANT
DRSG TEGADERM 6X8 (GAUZE/BANDAGES/DRESSINGS) ×3 IMPLANT
ELECT REM PT RETURN 15FT ADLT (MISCELLANEOUS) ×3 IMPLANT
GAUZE SPONGE 2X2 8PLY STRL LF (GAUZE/BANDAGES/DRESSINGS) ×2 IMPLANT
GLOVE BIO SURGEON STRL SZ 6.5 (GLOVE) ×3 IMPLANT
GLOVE BIOGEL M STRL SZ7.5 (GLOVE) ×6 IMPLANT
GOWN STRL REUS W/TWL LRG LVL3 (GOWN DISPOSABLE) ×9 IMPLANT
HOLDER FOLEY CATH W/STRAP (MISCELLANEOUS) ×3 IMPLANT
IRRIG SUCT STRYKERFLOW 2 WTIP (MISCELLANEOUS) ×3
IRRIGATION SUCT STRKRFLW 2 WTP (MISCELLANEOUS) ×2 IMPLANT
IV LACTATED RINGERS 1000ML (IV SOLUTION) ×3 IMPLANT
NDL SAFETY ECLIPSE 18X1.5 (NEEDLE) ×2 IMPLANT
NEEDLE HYPO 18GX1.5 SHARP (NEEDLE) ×1
PACK ROBOT UROLOGY CUSTOM (CUSTOM PROCEDURE TRAY) ×3 IMPLANT
RELOAD GREEN ECHELON 45 (STAPLE) ×3 IMPLANT
SEAL CANN UNIV 5-8 DVNC XI (MISCELLANEOUS) ×8 IMPLANT
SEAL XI 5MM-8MM UNIVERSAL (MISCELLANEOUS) ×4
SOLUTION ELECTROLUBE (MISCELLANEOUS) ×3 IMPLANT
SPONGE GAUZE 2X2 STER 10/PKG (GAUZE/BANDAGES/DRESSINGS) ×1
SUT ETHILON 3 0 PS 1 (SUTURE) ×3 IMPLANT
SUT MNCRL 3 0 RB1 (SUTURE) ×2 IMPLANT
SUT MNCRL 3 0 VIOLET RB1 (SUTURE) ×2 IMPLANT
SUT MNCRL AB 4-0 PS2 18 (SUTURE) ×6 IMPLANT
SUT MONOCRYL 3 0 RB1 (SUTURE) ×2
SUT VIC AB 0 CT1 27 (SUTURE) ×1
SUT VIC AB 0 CT1 27XBRD ANTBC (SUTURE) ×2 IMPLANT
SUT VIC AB 0 UR5 27 (SUTURE) ×3 IMPLANT
SUT VIC AB 2-0 SH 27 (SUTURE) ×1
SUT VIC AB 2-0 SH 27X BRD (SUTURE) ×2 IMPLANT
SUT VICRYL 0 UR6 27IN ABS (SUTURE) ×6 IMPLANT
SYR 27GX1/2 1ML LL SAFETY (SYRINGE) ×3 IMPLANT
TOWEL OR 17X26 10 PK STRL BLUE (TOWEL DISPOSABLE) ×3 IMPLANT
TOWEL OR NON WOVEN STRL DISP B (DISPOSABLE) ×3 IMPLANT
TUBING INSUFFLATION 10FT LAP (TUBING) IMPLANT
WATER STERILE IRR 1500ML POUR (IV SOLUTION) ×6 IMPLANT

## 2016-05-16 NOTE — Interval H&P Note (Signed)
History and Physical Interval Note:  05/16/2016 6:23 AM  Michael Contreras  has presented today for surgery, with the diagnosis of PROSTATE CANCER  The various methods of treatment have been discussed with the patient and family. After consideration of risks, benefits and other options for treatment, the patient has consented to  Procedure(s): XI ROBOTIC ASSISTED LAPAROSCOPIC RADICAL PROSTATECTOMY LEVEL 2 (N/A) PELVIC LYMPHADENECTOMY (Bilateral) as a surgical intervention .  The patient's history has been reviewed, patient examined, no change in status, stable for surgery.  I have reviewed the patient's chart and labs.  Questions were answered to the patient's satisfaction.     [name],LES

## 2016-05-16 NOTE — Anesthesia Postprocedure Evaluation (Addendum)
Anesthesia Post Note  Patient: Michael Contreras  Procedure(s) Performed: Procedure(s) (LRB): XI ROBOTIC ASSISTED LAPAROSCOPIC RADICAL PROSTATECTOMY LEVEL 2 (N/A) PELVIC LYMPHADENECTOMY (Bilateral)  Patient location during evaluation: PACU Anesthesia Type: General Level of consciousness: awake and alert and patient cooperative Pain management: pain level controlled Vital Signs Assessment: post-procedure vital signs reviewed and stable Respiratory status: spontaneous breathing and respiratory function stable Cardiovascular status: stable Anesthetic complications: no       Last Vitals:  Vitals:   05/16/16 1106 05/16/16 1127  BP:  118/76  Pulse:  63  Resp:  15  Temp: 36.3 C 36.3 C    Last Pain:  Vitals:   05/16/16 1134  TempSrc:   PainSc: [place]

## 2016-05-16 NOTE — Discharge Instructions (Signed)

## 2016-05-16 NOTE — Progress Notes (Signed)
Post-op note  Subjective: The patient is doing well.  No complaints.  Denies N/V  Objective: Vital signs in last 24 hours: Temp:  [97.1 F (36.2 C)-97.6 F (36.4 C)] 97.4 F (36.3 C) (03/22 1127) Pulse Rate:  [60-66] 63 (03/22 1127) Resp:  [12-18] 15 (03/22 1127) BP: (118-146)/(59-84) 118/76 (03/22 1127) SpO2:  [100 %] 100 % (03/22 1106) Weight:  [88.6 kg (195 lb 6 oz)] 88.6 kg (195 lb 6 oz) (03/22 0528)  Intake/Output from previous day: No intake/output data recorded. Intake/Output this shift: Total I/O In: 2962 [P.O.:462; I.V.:1500; IV Piggyback:1000] Out: 200 [Urine:50; Blood:150]  Physical Exam:  General: Alert and oriented. Abdomen: Soft, Nondistended. Incisions: Clean and dry. Urine: red  Lab Results:  Recent Labs  05/14/16 1000 05/16/16 1034  HGB 12.5* 11.4*  HCT 39.0 35.8*    Assessment/Plan: POD#0   1) Continue to monitor  2) DVT prophy, clears, IS, amb, pain control   LOS: 0 days   Garon Melander 05/16/2016, 2:12 PM

## 2016-05-16 NOTE — Anesthesia Procedure Notes (Signed)
Procedure Name: Intubation Date/Time: 05/16/2016 7:28 AM Performed by: Gaston Islam EVETTE Pre-anesthesia Checklist: Patient identified, Emergency Drugs available, Suction available and Patient being monitored Patient Re-evaluated:Patient Re-evaluated prior to inductionOxygen Delivery Method: Circle system utilized Preoxygenation: Pre-oxygenation with 100% oxygen Intubation Type: IV induction Ventilation: Mask ventilation without difficulty Laryngoscope Size: Mac and 4 Grade View: Grade I Tube type: Oral Tube size: 7.5 mm Number of attempts: 1 Airway Equipment and Method: Patient positioned with wedge pillow Placement Confirmation: ETT inserted through vocal cords under direct vision,  positive ETCO2,  CO2 detector and breath sounds checked- equal and bilateral Secured at: 22 cm Tube secured with: Tape Dental Injury: Teeth and Oropharynx as per pre-operative assessment

## 2016-05-16 NOTE — Anesthesia Preprocedure Evaluation (Signed)
Anesthesia Evaluation  Patient identified by MRN, date of birth, ID band Patient awake    Reviewed: Allergy & Precautions, H&P , NPO status , Patient's Chart, lab work & pertinent test results  Airway Mallampati: II   Neck ROM: full    Dental   Pulmonary former smoker,    breath sounds clear to auscultation       Cardiovascular negative cardio ROS   Rhythm:regular Rate:Normal     Neuro/Psych    GI/Hepatic hiatal hernia,   Endo/Other    Renal/GU    Prostate CA    Musculoskeletal  (+) Arthritis ,   Abdominal   Peds  Hematology   Anesthesia Other Findings   Reproductive/Obstetrics                             Anesthesia Physical Anesthesia Plan  ASA: II  Anesthesia Plan: General   Post-op Pain Management:    Induction: Intravenous  Airway Management Planned: Oral ETT  Additional Equipment:   Intra-op Plan:   Post-operative Plan: Extubation in OR  Informed Consent: I have reviewed the patients History and Physical, chart, labs and discussed the procedure including the risks, benefits and alternatives for the proposed anesthesia with the patient or authorized representative who has indicated his/her understanding and acceptance.     Plan Discussed with: CRNA, Anesthesiologist and Surgeon  Anesthesia Plan Comments:         Anesthesia Quick Evaluation

## 2016-05-16 NOTE — Transfer of Care (Signed)
Immediate Anesthesia Transfer of Care Note  Patient: Michael Contreras  Procedure(s) Performed: Procedure(s): XI ROBOTIC ASSISTED LAPAROSCOPIC RADICAL PROSTATECTOMY LEVEL 2 (N/A) PELVIC LYMPHADENECTOMY (Bilateral)  Patient Location: PACU  Anesthesia Type:General  Level of Consciousness: awake, alert  and oriented  Airway & Oxygen Therapy: Patient Spontanous Breathing and Patient connected to face mask oxygen  Post-op Assessment: Report given to RN and Post -op Vital signs reviewed and stable  Post vital signs: Reviewed  Last Vitals:  Vitals:   05/16/16 0518  BP: 121/76  Pulse: 60  Resp: 18  Temp: 36.4 C    Last Pain:  Vitals:   05/16/16 0528  TempSrc:   PainSc: 0-No pain      Patients Stated Pain Goal: 3 (20/81/38 8719)  Complications: No apparent anesthesia complications

## 2016-05-16 NOTE — Op Note (Signed)
Preoperative diagnosis: Clinically localized adenocarcinoma of the prostate (clinical stage T1c Nx Mx)  Postoperative diagnosis: Clinically localized adenocarcinoma of the prostate (clinical stage T1c Nx Mx)  Procedure:  1. Robotic assisted laparoscopic radical prostatectomy (left nerve sparing) 2. Bilateral robotic assisted laparoscopic pelvic lymphadenectomy  Surgeon: Pryor Curia. M.D.  Assistant(s): Debbrah Alar, PA-C  Anesthesia: General  Complications: None  EBL: 150 mL  IVF:  1500 mL crystalloid  Specimens: 1. Prostate and seminal vesicles 2. Right pelvic lymph nodes 3. Left pelvic lymph nodes  Disposition of specimens: Pathology  Drains: 1. 20 Fr coude catheter 2. # 19 Blake pelvic drain  Indication: Michael Contreras is a 66 y.o. patient with clinically localized prostate cancer.  After a thorough review of the management options for treatment of prostate cancer, he elected to proceed with surgical therapy and the above procedure(s).  We have discussed the potential benefits and risks of the procedure, side effects of the proposed treatment, the likelihood of the patient achieving the goals of the procedure, and any potential problems that might occur during the procedure or recuperation. Informed consent has been obtained.  Description of procedure:  The patient was taken to the operating room and a general anesthetic was administered. He was given preoperative antibiotics, placed in the dorsal lithotomy position, and prepped and draped in the usual sterile fashion. Next a preoperative timeout was performed. A urethral catheter was placed into the bladder and a site was selected near the umbilicus for placement of the camera port. This was placed using a standard open Hassan technique which allowed entry into the peritoneal cavity under direct vision and without difficulty. An 8 mm port was placed and a pneumoperitoneum established. The camera was then used to  inspect the abdomen and there was no evidence of any intra-abdominal injuries or other abnormalities. The remaining abdominal ports were then placed. 8 mm robotic ports were placed in the right lower quadrant, left lower quadrant, and far left lateral abdominal wall. A 5 mm port was placed in the right upper quadrant and a 12 mm port was placed in the right lateral abdominal wall for laparoscopic assistance. All ports were placed under direct vision without difficulty. The surgical cart was then docked.   Utilizing the cautery scissors, the bladder was reflected posteriorly allowing entry into the space of Retzius and identification of the endopelvic fascia and prostate. The periprostatic fat was then removed from the prostate allowing full exposure of the endopelvic fascia. The endopelvic fascia was then incised from the apex back to the base of the prostate bilaterally and the underlying levator muscle fibers were swept laterally off the prostate thereby isolating the dorsal venous complex. The dorsal vein was then stapled and divided with a 45 mm Flex Echelon stapler. Attention then turned to the bladder neck which was divided anteriorly thereby allowing entry into the bladder and exposure of the urethral catheter. The catheter balloon was deflated and the catheter was brought into the operative field and used to retract the prostate anteriorly. The posterior bladder neck was then examined and was divided allowing further dissection between the bladder and prostate posteriorly until the vasa deferentia and seminal vessels were identified. The vasa deferentia were isolated, divided, and lifted anteriorly. The seminal vesicles were dissected down to their tips with care to control the seminal vascular arterial blood supply. These structures were then lifted anteriorly and the space between Denonvillier's fascia and the anterior rectum was developed with a combination of sharp  and blunt dissection. This isolated  the vascular pedicles of the prostate.  The lateral prostatic fascia on the left side of the prostate was then sharply incised allowing release of the neurovascular bundle. The vascular pedicle of the prostate on the left side was then ligated with Weck clips between the prostate and neurovascular bundle and divided with sharp cold scissor dissection resulting in neurovascular bundle preservation. On the right side, a wide non nerve sparing dissection was performed with Weck clips used to ligate the vascular pedicle of the prostate. The neurovascular bundle on the left side was then separated off the apex of the prostate and urethra.   The urethra was then sharply transected allowing the prostate specimen to be disarticulated. The pelvis was copiously irrigated and hemostasis was ensured. There was no evidence for rectal injury.  Attention then turned to the right pelvic sidewall. The fibrofatty tissue between the external iliac vein, confluence of the iliac vessels, hypogastric artery, and Cooper's ligament was dissected free from the pelvic sidewall with care to preserve the obturator nerve. Weck clips were used for lymphostasis and hemostasis. An identical procedure was performed on the contralateral side and the lymphatic packets were removed for permanent pathologic analysis.  Attention then turned to the urethral anastomosis. A 2-0 Vicryl slip knot was placed between Denonvillier's fascia, the posterior bladder neck, and the posterior urethra to reapproximate these structures. A double-armed 3-0 Monocryl suture was then used to perform a 360 running tension-free anastomosis between the bladder neck and urethra. A new urethral catheter was then placed into the bladder and irrigated. There were no blood clots within the bladder and the anastomosis appeared to be watertight. A #19 Blake drain was then brought through the left lateral 8 mm port site and positioned appropriately within the pelvis. It was  secured to the skin with a nylon suture. The surgical cart was then undocked. The right lateral 12 mm port site was closed at the fascial level with a 0 Vicryl suture placed laparoscopically. All remaining ports were then removed under direct vision. The prostate specimen was removed intact within the Endopouch retrieval bag via the periumbilical camera port site. This fascial opening was closed with two running 0 Vicryl sutures. 0.25% Marcaine was then injected into all port sites and all incisions were reapproximated at the skin level with 4-0 Monocryl subcuticular sutures and Dermabond. The patient appeared to tolerate the procedure well and without complications. The patient was able to be extubated and transferred to the recovery unit in satisfactory condition.   Pryor Curia MD

## 2016-05-17 DIAGNOSIS — C61 Malignant neoplasm of prostate: Secondary | ICD-10-CM | POA: Diagnosis not present

## 2016-05-17 LAB — HEMOGLOBIN AND HEMATOCRIT, BLOOD
HEMATOCRIT: 33.1 % — AB (ref 39.0–52.0)
Hemoglobin: 10.9 g/dL — ABNORMAL LOW (ref 13.0–17.0)

## 2016-05-17 MED ORDER — BISACODYL 10 MG RE SUPP
10.0000 mg | Freq: Once | RECTAL | Status: AC
  Administered 2016-05-17: 10 mg via RECTAL
  Filled 2016-05-17: qty 1

## 2016-05-17 MED ORDER — HYDROCODONE-ACETAMINOPHEN 5-325 MG PO TABS: 1.0000 | ORAL_TABLET | Freq: Four times a day (QID) | ORAL | Status: DC | PRN

## 2016-05-17 NOTE — Progress Notes (Signed)
Patient ID: Michael Contreras, male   DOB: [birth year], 65 y.o.   MRN: [id number]  1 Day Post-Op Subjective: The patient is doing well.  No nausea or vomiting. Pain is adequately controlled.  Objective: Vital signs in last 24 hours: Temp:  [97.1 F (36.2 C)-97.8 F (36.6 C)] 97.8 F (36.6 C) (03/23 0534) Pulse Rate:  [50-66] 50 (03/23 0534) Resp:  [12-18] 18 (03/23 0534) BP: (110-146)/(59-84) 118/71 (03/23 0534) SpO2:  [95 %-100 %] 98 % (03/23 0534)  Intake/Output from previous day: 03/22 0701 - 03/23 0700 In: 6597 [P.O.:1392; I.V.:4155; IV Piggyback:1050] Out: 2340 [Urine:2075; Drains:115; Blood:150] Intake/Output this shift: No intake/output data recorded.  Physical Exam:  General: Alert and oriented. CV: RRR Lungs: Clear bilaterally. GI: Soft, Nondistended. Incisions: Clean, dry, and intact Urine: Clear Extremities: Nontender, no erythema, no edema.  Lab Results:  Recent Labs  05/14/16 1000 05/16/16 1034 05/17/16 0440  HGB 12.5* 11.4* 10.9*  HCT 39.0 35.8* 33.1*      Assessment/Plan: POD# 1 s/p robotic prostatectomy.  1) SL IVF 2) Ambulate, Incentive spirometry 3) Transition to oral pain medication 4) Dulcolax suppository 5) D/C pelvic drain 6) Plan for likely discharge later today   Michael Contreras. MD   LOS: 0 days   Michael Contreras,LES 05/17/2016, 7:36 AM

## 2016-05-17 NOTE — Discharge Summary (Signed)
  Date of admission: 05/16/2016  Date of discharge: 05/17/2016  Admission diagnosis: Prostate Cancer  Discharge diagnosis: Prostate Cancer  History and Physical: For full details, please see admission history and physical. Briefly, Michael Contreras is a 66 y.o. gentleman with localized prostate cancer.  After discussing management/treatment options, he elected to proceed with surgical treatment.  Hospital Course: Michael Contreras was taken to the operating room on 05/16/2016 and underwent a robotic assisted laparoscopic radical prostatectomy. He tolerated this procedure well and without complications. Postoperatively, he was able to be transferred to a regular hospital room following recovery from anesthesia.  He was able to begin ambulating the night of surgery. He remained hemodynamically stable overnight.  He had excellent urine output with appropriately minimal output from his pelvic drain and his pelvic drain was removed on POD #1.  He was transitioned to oral pain medication, tolerated a clear liquid diet, and had met all discharge criteria and was able to be discharged home later on POD#1.  Laboratory values:  Recent Labs  05/16/16 1034 05/17/16 0440  HGB 11.4* 10.9*  HCT 35.8* 33.1*    Disposition: Home  Discharge instruction: He was instructed to be ambulatory but to refrain from heavy lifting, strenuous activity, or driving. He was instructed on urethral catheter care.  Discharge medications:  Allergies as of 05/17/2016   No Known Allergies     Medication List    TAKE these medications   HYDROcodone-acetaminophen 5-325 MG tablet Commonly known as:  NORCO Take 1-2 tablets by mouth every 6 (six) hours as needed for moderate pain or severe pain.   sulfamethoxazole-trimethoprim 800-160 MG tablet Commonly known as:  BACTRIM DS,SEPTRA DS Take 1 tablet by mouth 2 (two) times daily. Start the day prior to foley removal appointment       Followup: He will followup in 1 week  for catheter removal and to discuss his surgical pathology results.

## 2016-05-17 NOTE — Progress Notes (Signed)
Completed D/C teaching. Gave prescriptions. Demonstrated foley care. Patient will be D/C home with family in stable condition.

## 2016-06-11 DIAGNOSIS — M62838 Other muscle spasm: Secondary | ICD-10-CM | POA: Diagnosis not present

## 2016-06-11 DIAGNOSIS — M6281 Muscle weakness (generalized): Secondary | ICD-10-CM | POA: Diagnosis not present

## 2016-06-11 DIAGNOSIS — R350 Frequency of micturition: Secondary | ICD-10-CM | POA: Diagnosis not present

## 2016-06-11 DIAGNOSIS — N393 Stress incontinence (female) (male): Secondary | ICD-10-CM | POA: Diagnosis not present

## 2016-06-21 DIAGNOSIS — R350 Frequency of micturition: Secondary | ICD-10-CM | POA: Diagnosis not present

## 2016-06-21 DIAGNOSIS — M6281 Muscle weakness (generalized): Secondary | ICD-10-CM | POA: Diagnosis not present

## 2016-06-21 DIAGNOSIS — M62838 Other muscle spasm: Secondary | ICD-10-CM | POA: Diagnosis not present

## 2016-06-21 DIAGNOSIS — N393 Stress incontinence (female) (male): Secondary | ICD-10-CM | POA: Diagnosis not present

## 2016-07-16 DIAGNOSIS — M6281 Muscle weakness (generalized): Secondary | ICD-10-CM | POA: Diagnosis not present

## 2016-07-16 DIAGNOSIS — R350 Frequency of micturition: Secondary | ICD-10-CM | POA: Diagnosis not present

## 2016-07-16 DIAGNOSIS — N393 Stress incontinence (female) (male): Secondary | ICD-10-CM | POA: Diagnosis not present

## 2016-07-16 DIAGNOSIS — M62838 Other muscle spasm: Secondary | ICD-10-CM | POA: Diagnosis not present

## 2016-07-26 DIAGNOSIS — L42 Pityriasis rosea: Secondary | ICD-10-CM | POA: Diagnosis not present

## 2016-07-26 DIAGNOSIS — Z6824 Body mass index (BMI) 24.0-24.9, adult: Secondary | ICD-10-CM | POA: Diagnosis not present

## 2016-08-08 DIAGNOSIS — M62838 Other muscle spasm: Secondary | ICD-10-CM | POA: Diagnosis not present

## 2016-08-08 DIAGNOSIS — M6281 Muscle weakness (generalized): Secondary | ICD-10-CM | POA: Diagnosis not present

## 2016-08-08 DIAGNOSIS — N393 Stress incontinence (female) (male): Secondary | ICD-10-CM | POA: Diagnosis not present

## 2016-08-15 DIAGNOSIS — C61 Malignant neoplasm of prostate: Secondary | ICD-10-CM | POA: Diagnosis not present

## 2016-08-21 DIAGNOSIS — N5201 Erectile dysfunction due to arterial insufficiency: Secondary | ICD-10-CM | POA: Diagnosis not present

## 2016-08-21 DIAGNOSIS — N393 Stress incontinence (female) (male): Secondary | ICD-10-CM | POA: Diagnosis not present

## 2016-08-21 DIAGNOSIS — C61 Malignant neoplasm of prostate: Secondary | ICD-10-CM | POA: Diagnosis not present

## 2016-09-09 DIAGNOSIS — M1009 Idiopathic gout, multiple sites: Secondary | ICD-10-CM | POA: Diagnosis not present

## 2016-09-09 DIAGNOSIS — R937 Abnormal findings on diagnostic imaging of other parts of musculoskeletal system: Secondary | ICD-10-CM | POA: Diagnosis not present

## 2016-09-09 DIAGNOSIS — M19072 Primary osteoarthritis, left ankle and foot: Secondary | ICD-10-CM | POA: Diagnosis not present

## 2016-09-09 DIAGNOSIS — M25572 Pain in left ankle and joints of left foot: Secondary | ICD-10-CM | POA: Diagnosis not present

## 2016-09-09 DIAGNOSIS — Z6824 Body mass index (BMI) 24.0-24.9, adult: Secondary | ICD-10-CM | POA: Diagnosis not present

## 2016-09-09 DIAGNOSIS — M79672 Pain in left foot: Secondary | ICD-10-CM | POA: Diagnosis not present

## 2016-09-13 DIAGNOSIS — R932 Abnormal findings on diagnostic imaging of liver and biliary tract: Secondary | ICD-10-CM | POA: Diagnosis not present

## 2016-09-13 DIAGNOSIS — N281 Cyst of kidney, acquired: Secondary | ICD-10-CM | POA: Diagnosis not present

## 2016-09-13 DIAGNOSIS — R1084 Generalized abdominal pain: Secondary | ICD-10-CM | POA: Diagnosis not present

## 2016-10-16 NOTE — Addendum Note (Signed)
Addendum  created 10/16/16 1345 by Albertha Ghee, MD   Sign clinical note

## 2017-02-28 DIAGNOSIS — R42 Dizziness and giddiness: Secondary | ICD-10-CM | POA: Diagnosis not present

## 2017-02-28 DIAGNOSIS — Z6825 Body mass index (BMI) 25.0-25.9, adult: Secondary | ICD-10-CM | POA: Diagnosis not present

## 2017-02-28 DIAGNOSIS — M7122 Synovial cyst of popliteal space [Baker], left knee: Secondary | ICD-10-CM | POA: Diagnosis not present

## 2017-03-10 DIAGNOSIS — I82532 Chronic embolism and thrombosis of left popliteal vein: Secondary | ICD-10-CM | POA: Diagnosis not present

## 2017-03-10 DIAGNOSIS — Z6825 Body mass index (BMI) 25.0-25.9, adult: Secondary | ICD-10-CM | POA: Diagnosis not present

## 2017-03-11 DIAGNOSIS — R600 Localized edema: Secondary | ICD-10-CM | POA: Diagnosis not present

## 2017-03-11 DIAGNOSIS — I82532 Chronic embolism and thrombosis of left popliteal vein: Secondary | ICD-10-CM | POA: Diagnosis not present

## 2017-03-19 DIAGNOSIS — C61 Malignant neoplasm of prostate: Secondary | ICD-10-CM | POA: Diagnosis not present

## 2017-03-26 DIAGNOSIS — C61 Malignant neoplasm of prostate: Secondary | ICD-10-CM | POA: Diagnosis not present

## 2017-03-26 DIAGNOSIS — N5201 Erectile dysfunction due to arterial insufficiency: Secondary | ICD-10-CM | POA: Diagnosis not present

## 2017-04-08 DIAGNOSIS — I420 Dilated cardiomyopathy: Secondary | ICD-10-CM | POA: Diagnosis not present

## 2017-04-08 DIAGNOSIS — M179 Osteoarthritis of knee, unspecified: Secondary | ICD-10-CM | POA: Diagnosis not present

## 2017-04-08 DIAGNOSIS — M25562 Pain in left knee: Secondary | ICD-10-CM | POA: Diagnosis not present

## 2017-04-08 DIAGNOSIS — M542 Cervicalgia: Secondary | ICD-10-CM | POA: Diagnosis not present

## 2017-04-08 DIAGNOSIS — M11262 Other chondrocalcinosis, left knee: Secondary | ICD-10-CM | POA: Diagnosis not present

## 2017-04-08 DIAGNOSIS — M4722 Other spondylosis with radiculopathy, cervical region: Secondary | ICD-10-CM | POA: Diagnosis not present

## 2017-04-08 DIAGNOSIS — R0602 Shortness of breath: Secondary | ICD-10-CM | POA: Diagnosis not present

## 2017-04-08 DIAGNOSIS — Z6825 Body mass index (BMI) 25.0-25.9, adult: Secondary | ICD-10-CM | POA: Diagnosis not present

## 2017-04-11 DIAGNOSIS — I420 Dilated cardiomyopathy: Secondary | ICD-10-CM | POA: Diagnosis not present

## 2017-04-22 DIAGNOSIS — I6523 Occlusion and stenosis of bilateral carotid arteries: Secondary | ICD-10-CM | POA: Diagnosis not present

## 2017-05-14 DIAGNOSIS — R600 Localized edema: Secondary | ICD-10-CM | POA: Diagnosis not present

## 2017-05-14 DIAGNOSIS — M1712 Unilateral primary osteoarthritis, left knee: Secondary | ICD-10-CM | POA: Diagnosis not present

## 2017-05-14 DIAGNOSIS — M25562 Pain in left knee: Secondary | ICD-10-CM | POA: Diagnosis not present

## 2017-05-24 DIAGNOSIS — T1490XA Injury, unspecified, initial encounter: Secondary | ICD-10-CM | POA: Diagnosis not present

## 2017-05-26 DIAGNOSIS — S51811A Laceration without foreign body of right forearm, initial encounter: Secondary | ICD-10-CM | POA: Diagnosis not present

## 2017-05-26 DIAGNOSIS — Z6825 Body mass index (BMI) 25.0-25.9, adult: Secondary | ICD-10-CM | POA: Diagnosis not present

## 2017-06-25 DIAGNOSIS — R42 Dizziness and giddiness: Secondary | ICD-10-CM | POA: Diagnosis not present

## 2017-06-25 DIAGNOSIS — Z6825 Body mass index (BMI) 25.0-25.9, adult: Secondary | ICD-10-CM | POA: Diagnosis not present

## 2017-08-20 DIAGNOSIS — M79605 Pain in left leg: Secondary | ICD-10-CM | POA: Diagnosis not present

## 2017-08-20 DIAGNOSIS — M25562 Pain in left knee: Secondary | ICD-10-CM | POA: Diagnosis not present

## 2017-08-20 DIAGNOSIS — M25561 Pain in right knee: Secondary | ICD-10-CM | POA: Diagnosis not present

## 2017-08-20 DIAGNOSIS — Z6826 Body mass index (BMI) 26.0-26.9, adult: Secondary | ICD-10-CM | POA: Diagnosis not present

## 2017-09-01 DIAGNOSIS — M25562 Pain in left knee: Secondary | ICD-10-CM | POA: Diagnosis not present

## 2017-09-01 DIAGNOSIS — Z1389 Encounter for screening for other disorder: Secondary | ICD-10-CM | POA: Diagnosis not present

## 2017-09-01 DIAGNOSIS — Z6825 Body mass index (BMI) 25.0-25.9, adult: Secondary | ICD-10-CM | POA: Diagnosis not present

## 2017-09-09 DIAGNOSIS — M1712 Unilateral primary osteoarthritis, left knee: Secondary | ICD-10-CM | POA: Diagnosis not present

## 2017-10-06 DIAGNOSIS — Z0000 Encounter for general adult medical examination without abnormal findings: Secondary | ICD-10-CM | POA: Diagnosis not present

## 2017-10-06 DIAGNOSIS — Z6826 Body mass index (BMI) 26.0-26.9, adult: Secondary | ICD-10-CM | POA: Diagnosis not present

## 2017-10-06 DIAGNOSIS — Z125 Encounter for screening for malignant neoplasm of prostate: Secondary | ICD-10-CM | POA: Diagnosis not present

## 2017-10-06 DIAGNOSIS — M25561 Pain in right knee: Secondary | ICD-10-CM | POA: Diagnosis not present

## 2017-10-06 DIAGNOSIS — M25562 Pain in left knee: Secondary | ICD-10-CM | POA: Diagnosis not present

## 2017-10-06 DIAGNOSIS — Z6825 Body mass index (BMI) 25.0-25.9, adult: Secondary | ICD-10-CM | POA: Diagnosis not present

## 2017-10-15 DIAGNOSIS — C61 Malignant neoplasm of prostate: Secondary | ICD-10-CM | POA: Diagnosis not present

## 2017-10-18 IMAGING — DX DG CHEST 2V
2 series · 2 of 2 positions shown · non-contrast
Comparison: Chest radiograph January 08, 2014 ; chest CT August 12, 2007

CLINICAL DATA: Preoperative prostatectomy.

EXAM:
CHEST  2 VIEW

[chest pa]
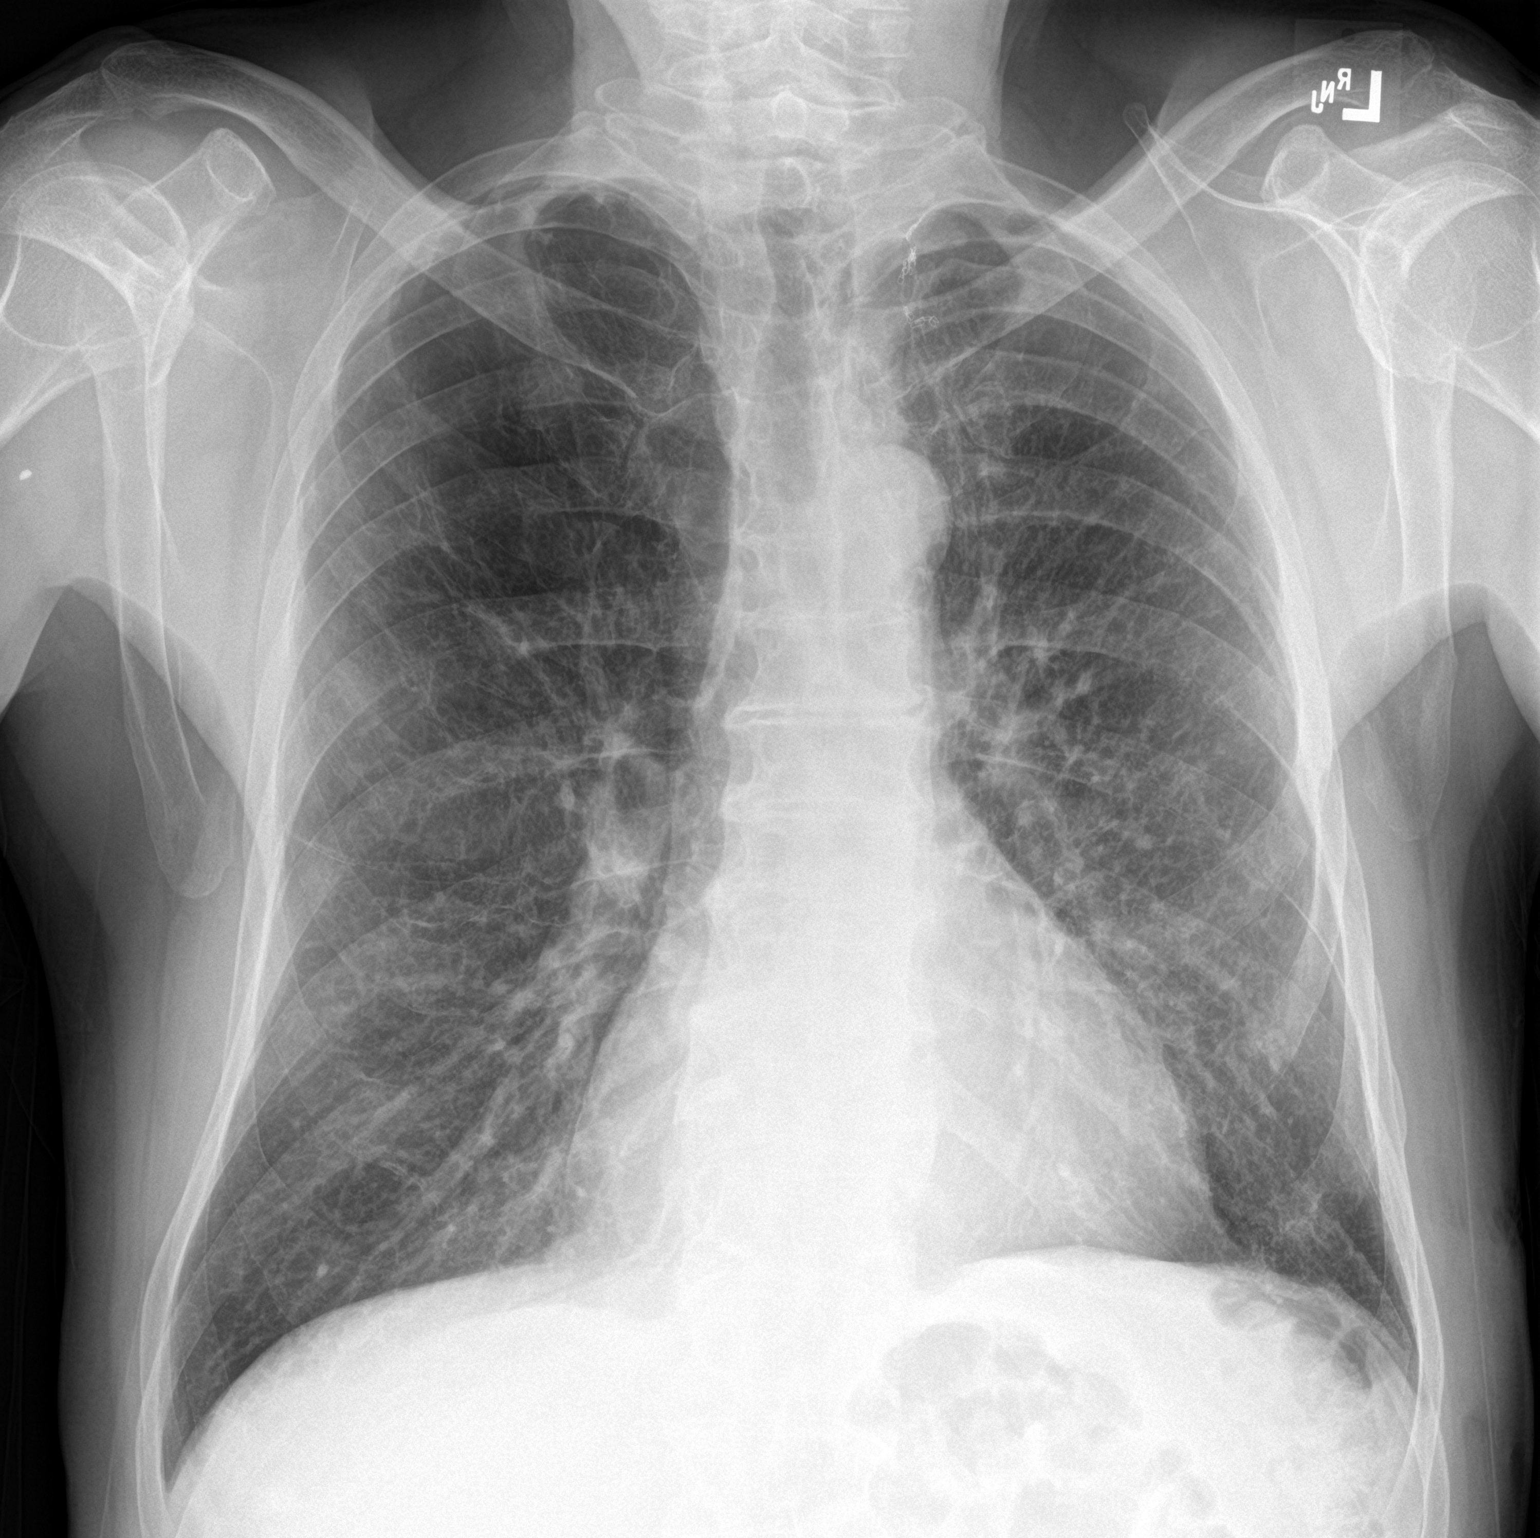

[chest lat]
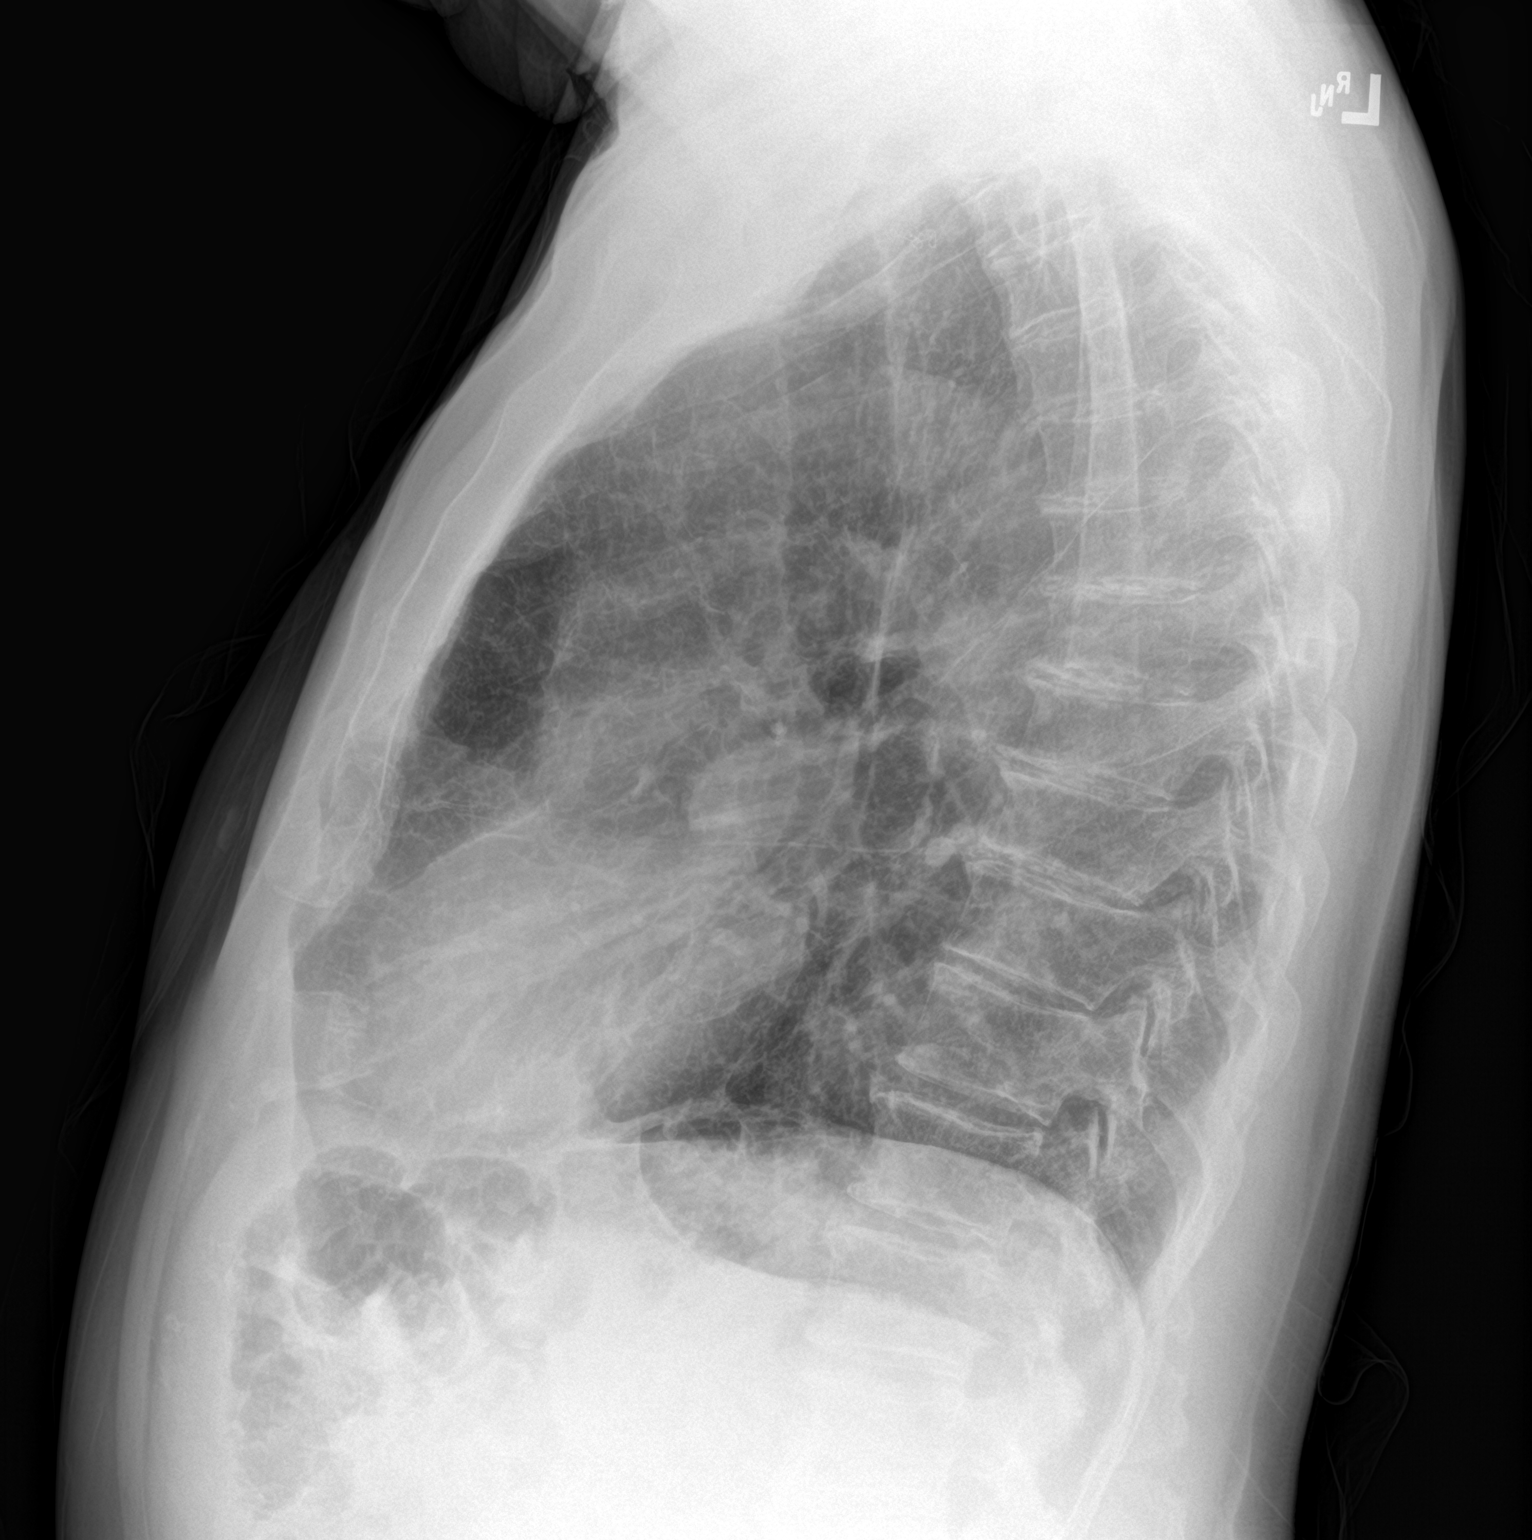

[2 of 2 positions shown; findings below may reference images not displayed]

FINDINGS: There is underlying emphysematous change with bullous disease
primarily in the upper lobes, more severe on the right than on the
left, well demonstrated on prior CT. There is postoperative change
in the left apex region.

There is underlying patchy interstitial fibrosis. There is no frank
edema or consolidation. The heart size is within normal limits. The
pulmonary vascularity reflects the underlying emphysematous change
with relatively decreased upper lobe vascularity in the areas of
bullous disease. No adenopathy. There is degenerative change in the
thoracic spine. No blastic or lytic bone lesions.
IMPRESSION: Postoperative change medial left apex. Underlying emphysematous
change with areas of bullous disease, most severe in the right upper
lobe. Areas of scarring with fibrotic type change. No edema or
consolidation. Heart size within normal limits. No evident
adenopathy.

## 2017-10-22 DIAGNOSIS — N5201 Erectile dysfunction due to arterial insufficiency: Secondary | ICD-10-CM | POA: Diagnosis not present

## 2017-10-22 DIAGNOSIS — C61 Malignant neoplasm of prostate: Secondary | ICD-10-CM | POA: Diagnosis not present

## 2018-03-05 DIAGNOSIS — Z6825 Body mass index (BMI) 25.0-25.9, adult: Secondary | ICD-10-CM | POA: Diagnosis not present

## 2018-03-05 DIAGNOSIS — S8392XA Sprain of unspecified site of left knee, initial encounter: Secondary | ICD-10-CM | POA: Diagnosis not present

## 2018-03-12 DIAGNOSIS — M1712 Unilateral primary osteoarthritis, left knee: Secondary | ICD-10-CM | POA: Diagnosis not present

## 2018-04-22 DIAGNOSIS — C61 Malignant neoplasm of prostate: Secondary | ICD-10-CM | POA: Diagnosis not present

## 2018-04-22 DIAGNOSIS — N5201 Erectile dysfunction due to arterial insufficiency: Secondary | ICD-10-CM | POA: Diagnosis not present

## 2018-06-10 DIAGNOSIS — Z6825 Body mass index (BMI) 25.0-25.9, adult: Secondary | ICD-10-CM | POA: Diagnosis not present

## 2018-06-10 DIAGNOSIS — A932 Colorado tick fever: Secondary | ICD-10-CM | POA: Diagnosis not present

## 2018-07-03 DIAGNOSIS — M25462 Effusion, left knee: Secondary | ICD-10-CM | POA: Diagnosis not present

## 2018-07-03 DIAGNOSIS — M1712 Unilateral primary osteoarthritis, left knee: Secondary | ICD-10-CM | POA: Diagnosis not present

## 2018-07-03 DIAGNOSIS — G8929 Other chronic pain: Secondary | ICD-10-CM | POA: Diagnosis not present

## 2018-07-03 DIAGNOSIS — M25562 Pain in left knee: Secondary | ICD-10-CM | POA: Diagnosis not present

## 2018-07-09 DIAGNOSIS — M25562 Pain in left knee: Secondary | ICD-10-CM | POA: Diagnosis not present

## 2018-07-14 DIAGNOSIS — S83242A Other tear of medial meniscus, current injury, left knee, initial encounter: Secondary | ICD-10-CM | POA: Diagnosis not present

## 2018-07-17 DIAGNOSIS — Z1159 Encounter for screening for other viral diseases: Secondary | ICD-10-CM | POA: Diagnosis not present

## 2018-07-21 DIAGNOSIS — S83242A Other tear of medial meniscus, current injury, left knee, initial encounter: Secondary | ICD-10-CM | POA: Diagnosis not present

## 2018-07-21 DIAGNOSIS — Z87891 Personal history of nicotine dependence: Secondary | ICD-10-CM | POA: Diagnosis not present

## 2018-07-21 DIAGNOSIS — Z8546 Personal history of malignant neoplasm of prostate: Secondary | ICD-10-CM | POA: Diagnosis not present

## 2018-07-21 DIAGNOSIS — S83282A Other tear of lateral meniscus, current injury, left knee, initial encounter: Secondary | ICD-10-CM | POA: Diagnosis not present

## 2018-07-21 DIAGNOSIS — M1712 Unilateral primary osteoarthritis, left knee: Secondary | ICD-10-CM | POA: Diagnosis not present

## 2018-07-21 DIAGNOSIS — M23207 Derangement of unspecified meniscus due to old tear or injury, left knee: Secondary | ICD-10-CM | POA: Diagnosis not present

## 2018-10-20 DIAGNOSIS — M1712 Unilateral primary osteoarthritis, left knee: Secondary | ICD-10-CM | POA: Diagnosis not present

## 2018-10-20 DIAGNOSIS — M25462 Effusion, left knee: Secondary | ICD-10-CM | POA: Diagnosis not present

## 2018-11-12 DIAGNOSIS — M10072 Idiopathic gout, left ankle and foot: Secondary | ICD-10-CM | POA: Diagnosis not present

## 2018-11-12 DIAGNOSIS — Z6825 Body mass index (BMI) 25.0-25.9, adult: Secondary | ICD-10-CM | POA: Diagnosis not present

## 2018-12-02 DIAGNOSIS — C61 Malignant neoplasm of prostate: Secondary | ICD-10-CM | POA: Diagnosis not present

## 2018-12-02 DIAGNOSIS — N5201 Erectile dysfunction due to arterial insufficiency: Secondary | ICD-10-CM | POA: Diagnosis not present

## 2018-12-22 DIAGNOSIS — M1712 Unilateral primary osteoarthritis, left knee: Secondary | ICD-10-CM | POA: Diagnosis not present

## 2018-12-22 DIAGNOSIS — M25462 Effusion, left knee: Secondary | ICD-10-CM | POA: Diagnosis not present

## 2019-02-02 DIAGNOSIS — M1712 Unilateral primary osteoarthritis, left knee: Secondary | ICD-10-CM | POA: Diagnosis not present

## 2019-02-02 DIAGNOSIS — M25462 Effusion, left knee: Secondary | ICD-10-CM | POA: Diagnosis not present

## 2019-02-11 DIAGNOSIS — Z6825 Body mass index (BMI) 25.0-25.9, adult: Secondary | ICD-10-CM | POA: Diagnosis not present

## 2019-02-11 DIAGNOSIS — S60862A Insect bite (nonvenomous) of left wrist, initial encounter: Secondary | ICD-10-CM | POA: Diagnosis not present

## 2019-03-23 DIAGNOSIS — M1712 Unilateral primary osteoarthritis, left knee: Secondary | ICD-10-CM | POA: Diagnosis not present

## 2019-03-23 DIAGNOSIS — M25462 Effusion, left knee: Secondary | ICD-10-CM | POA: Diagnosis not present

## 2019-03-30 DIAGNOSIS — M25562 Pain in left knee: Secondary | ICD-10-CM | POA: Diagnosis not present

## 2019-03-30 DIAGNOSIS — Z6825 Body mass index (BMI) 25.0-25.9, adult: Secondary | ICD-10-CM | POA: Diagnosis not present

## 2019-04-08 ENCOUNTER — Ambulatory Visit (INDEPENDENT_AMBULATORY_CARE_PROVIDER_SITE_OTHER): Payer: Medicare HMO

## 2019-04-08 ENCOUNTER — Encounter: Payer: Self-pay | Admitting: Orthopaedic Surgery

## 2019-04-08 ENCOUNTER — Ambulatory Visit (INDEPENDENT_AMBULATORY_CARE_PROVIDER_SITE_OTHER): Payer: Medicare HMO | Admitting: Orthopaedic Surgery

## 2019-04-08 VITALS — BP 128/86 | HR 76 | Ht 74.0 in | Wt 200.0 lb

## 2019-04-08 DIAGNOSIS — M25562 Pain in left knee: Secondary | ICD-10-CM | POA: Diagnosis not present

## 2019-04-08 DIAGNOSIS — G8929 Other chronic pain: Secondary | ICD-10-CM

## 2019-04-08 DIAGNOSIS — M1712 Unilateral primary osteoarthritis, left knee: Secondary | ICD-10-CM | POA: Diagnosis not present

## 2019-04-08 NOTE — Progress Notes (Signed)
Office Visit Note   Patient: Michael Contreras           Date of Birth: 08/12/1950           MRN: [id number] Visit Date: 04/08/2019              Requested by: Neale Burly, MD [place],  [place] [id number] PCP: Neale Burly, MD   Assessment & Plan: Visit Diagnoses:  1. Chronic pain of left knee   2. Unilateral primary osteoarthritis, left knee     Plan: Patient is end-stage left knee osteoarthritis with full-thickness cartilage loss by MRI scan 07/10/2018.  Previous meniscectomies last year with progressive varus deformity.  He has had intra-articular injections he is to cane had Visco injections all without relief and would like to proceed with total knee arthroplasty.  We discussed overnight stay use of spinal, home physical therapy x2 weeks and then outpatient therapy for 2 to 3 weeks.  He lives alone but has a brother who lives close to him and he could stay with his brother after the procedure.  Questions elicited and answered.  He understands requests we proceed.  Follow-Up Instructions: No follow-ups on file.   Orders:  Orders Placed This Encounter  Procedures  . XR KNEE 3 VIEW LEFT   No orders of the defined types were placed in this encounter.     Procedures: No procedures performed   Clinical Data: No additional findings.   Subjective: Chief Complaint  Patient presents with  . Left Knee - Pain    HPI 69 year old male with end-stage left knee osteoarthritis varus deformity.  Previous medial meniscectomy lateral meniscectomy by Dr.Case in May 2020 with persistent pain and catching.  He has had Baker's cyst persistent knee effusion had recent repeat aspiration of his knee and within few days recurrence of tense knee effusion and significant pain.  Patient is noticed progressive varus deformity of his left knee minimal knee symptoms in his right knee.  Patient was referred by [facility] for consideration for left total knee arthroplasty.  Patient  quit smoking 15 years ago.  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  Increased pain with ambulation pain with community ambulation has been treated with intra-articular injections Synvisc, cortisone, anti-inflammatories all without relief.  Review of Systems 14 point review of systems negative for stroke heart attack chest pain GI or kidney problems.  Past smoker quit 15 years ago.  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  He does not drink.   Objective: Vital Signs: BP 128/86   Pulse 76   Ht 6\' 2"  (1.88 m)   Wt 200 lb (90.7 kg)   BMI 25.68 kg/m   Physical Exam Constitutional:      Appearance: He is well-developed.  HENT:     Head: Normocephalic and atraumatic.  Eyes:     Pupils: Pupils are equal, round, and reactive to light.  Neck:     Thyroid: No thyromegaly.     Trachea: No tracheal deviation.  Cardiovascular:     Rate and Rhythm: Normal rate.  Pulmonary:     Effort: Pulmonary effort is normal.     Breath sounds: No wheezing.  Abdominal:     General: Bowel sounds are normal.     Palpations: Abdomen is soft.  Skin:    General: Skin is warm and dry.     Capillary Refill: Capillary refill takes less  than 2 seconds.  Neurological:     Mental Status: He is alert and oriented to person, place, and time.  Psychiatric:        Behavior: Behavior normal.        Thought Content: Thought content normal.        Judgment: Judgment normal.     Ortho Exam patient has negative straight leg raising 90 degrees distal pulses are 2+ posterior tib 1+ dorsalis pedis.  Negative logroll to the hips no static notch tenderness.  He has varus deformity left knee and has lateral knee thrust with ambulation gait.  Knee comes within 4 degrees reaching full extension flexes 220 degrees with crepitus medial joint line swelling and 3-4+ knee effusion with moderate-sized Baker's cyst.  Specialty Comments:  No specialty comments  available.  Imaging: XR KNEE 3 VIEW LEFT  Result Date: 04/08/2019 Standing AP both knees lateral left knee sunrise patellar x-ray demonstrates bone-on-bone changes left knee medial compartment 10degrees varus deformity.  Marginal osteophytes subchondral sclerosis. Impression left knee osteoarthritis, moderate to severe with bone-on-bone medial compartment.    PMFS History: Patient Active Problem List   Diagnosis Date Noted  . Unilateral primary osteoarthritis, left knee 04/08/2019  . Prostate cancer ([place]) 05/16/2016  . Abscess    Past Medical History:  Diagnosis Date  . Arthritis   . History of hiatal hernia    inguinal   . Prostate cancer ([place])     No family history on file.  Past Surgical History:  Procedure Laterality Date  . APPENDECTOMY    . HERNIA REPAIR    . LYMPHADENECTOMY Bilateral 05/16/2016   Procedure: PELVIC LYMPHADENECTOMY;  Surgeon: Raynelle Bring, MD;  Location: [facility];  Service: Urology;  Laterality: Bilateral;  . ROBOT ASSISTED LAPAROSCOPIC RADICAL PROSTATECTOMY N/A 05/16/2016   Procedure: XI ROBOTIC ASSISTED LAPAROSCOPIC RADICAL PROSTATECTOMY LEVEL 2;  Surgeon: Raynelle Bring, MD;  Location: [facility];  Service: Urology;  Laterality: N/A;   Social History   Occupational History  . Not on file  Tobacco Use  . Smoking status: Former Smoker    Packs/day: 2.00    Types: Cigarettes    Quit date: 01/05/2013    Years since quitting: 6.2  . Smokeless tobacco: Never Used  Substance and Sexual Activity  . Alcohol use: No  . Drug use: No  . Sexual activity: Not on file

## 2019-04-13 ENCOUNTER — Other Ambulatory Visit: Payer: Self-pay

## 2019-04-15 ENCOUNTER — Ambulatory Visit: Payer: Medicare HMO | Admitting: Surgery

## 2019-04-19 NOTE — Progress Notes (Signed)
[id number] - [place], [address] [place] [address] Phone: [phone] Fax: [phone]      Your procedure is scheduled on Friday, February 26th.  Report to [facility] Main Entrance "A" at 8:00 A.M., and check in at the Admitting office.  Call this number if you have problems the morning of surgery:  [phone]  Call [phone] if you have any questions prior to your surgery date Monday-Friday 8am-4pm    Remember:  Do not eat after midnight the night before your surgery  You may drink clear liquids until 7:00 the morning of your surgery.   Clear liquids allowed are: Water, Non-Citrus Juices (without pulp), Carbonated Beverages, Clear Tea, Black Coffee Only, and Gatorade    Take these medicines the morning of surgery with A SIP OF WATER   NONE  7 days prior to surgery STOP taking any Aspirin (unless otherwise instructed by your surgeon), Aleve, Naproxen, Ibuprofen, Motrin, Advil, Goody's, BC's, all herbal medications, fish oil, and all vitamins.    The Morning of Surgery  Do not wear jewelry.  Do not wear lotions, powders, colognes, or deodorant  Men may shave face and neck.  Do not bring valuables to the hospital.  [facility] is not responsible for any belongings or valuables.  If you are a smoker, DO NOT Smoke 24 hours prior to surgery  If you wear a CPAP at night please bring your mask the morning of surgery   Remember that you must have someone to transport you home after your surgery, and remain with you for 24 hours if you are discharged the same day.   Please bring cases for contacts, glasses, hearing aids, dentures or bridgework because it cannot be worn into surgery.    Leave your suitcase in the car.  After surgery it may be brought to your room.  For patients admitted to the hospital, discharge time will be determined by your treatment team.  Patients  discharged the day of surgery will not be allowed to drive home.    Special instructions:   [facility]- Preparing For Surgery  Before surgery, you can play an important role. Because skin is not sterile, your skin needs to be as free of germs as possible. You can reduce the number of germs on your skin by washing with CHG (chlorahexidine gluconate) Soap before surgery.  CHG is an antiseptic cleaner which kills germs and bonds with the skin to continue killing germs even after washing.    Oral Hygiene is also important to reduce your risk of infection.  Remember - BRUSH YOUR TEETH THE MORNING OF SURGERY WITH YOUR REGULAR TOOTHPASTE  Please do not use if you have an allergy to CHG or antibacterial soaps. If your skin becomes reddened/irritated stop using the CHG.  Do not shave (including legs and underarms) for at least 48 hours prior to first CHG shower. It is OK to shave your face.  Please follow these instructions carefully.   1. Shower the NIGHT BEFORE SURGERY and the MORNING OF SURGERY with CHG Soap.   2. If you chose to wash your hair, wash your hair first as usual with your normal shampoo.  3. After you shampoo, rinse your hair and body thoroughly to remove the shampoo.  4. Use CHG as you would any other liquid soap. You can apply CHG directly to the skin and wash gently  with a scrungie or a clean washcloth.   5. Apply the CHG Soap to your body ONLY FROM THE NECK DOWN.  Do not use on open wounds or open sores. Avoid contact with your eyes, ears, mouth and genitals (private parts). Wash Face and genitals (private parts)  with your normal soap.   6. Wash thoroughly, paying special attention to the area where your surgery will be performed.  7. Thoroughly rinse your body with warm water from the neck down.  8. DO NOT shower/wash with your normal soap after using and rinsing off the CHG Soap.  9. Pat yourself dry with a CLEAN TOWEL.  10. Wear CLEAN PAJAMAS to bed the night before  surgery, wear comfortable clothes the morning of surgery  11. Place CLEAN SHEETS on your bed the night of your first shower and DO NOT SLEEP WITH PETS.    Day of Surgery:  Please shower the morning of surgery with the CHG soap Do not apply any deodorants/lotions. Please wear clean clothes to the hospital/surgery center.   Remember to brush your teeth WITH YOUR REGULAR TOOTHPASTE.   Please read over the following fact sheets that you were given.

## 2019-04-20 ENCOUNTER — Ambulatory Visit (HOSPITAL_COMMUNITY)
Admission: RE | Admit: 2019-04-20 | Discharge: 2019-04-20 | Disposition: A | Discharge status: Home / Self Care | Payer: Medicare HMO | Source: Ambulatory Visit | Attending: Surgery | Admitting: Surgery

## 2019-04-20 ENCOUNTER — Other Ambulatory Visit (HOSPITAL_COMMUNITY)
Admission: RE | Admit: 2019-04-20 | Discharge: 2019-04-20 | Disposition: A | Discharge status: Home / Self Care | Payer: Medicare HMO | Source: Ambulatory Visit | Attending: Orthopaedic Surgery | Admitting: Orthopaedic Surgery

## 2019-04-20 ENCOUNTER — Encounter (HOSPITAL_COMMUNITY)
Admission: RE | Admit: 2019-04-20 | Discharge: 2019-04-20 | Disposition: A | Discharge status: Home / Self Care | Payer: Medicare HMO | Source: Ambulatory Visit | Attending: Orthopaedic Surgery | Admitting: Orthopaedic Surgery

## 2019-04-20 ENCOUNTER — Encounter (HOSPITAL_COMMUNITY): Payer: Self-pay

## 2019-04-20 ENCOUNTER — Other Ambulatory Visit: Payer: Self-pay

## 2019-04-20 DIAGNOSIS — Z01812 Encounter for preprocedural laboratory examination: Secondary | ICD-10-CM | POA: Insufficient documentation

## 2019-04-20 DIAGNOSIS — Z01810 Encounter for preprocedural cardiovascular examination: Secondary | ICD-10-CM | POA: Insufficient documentation

## 2019-04-20 DIAGNOSIS — Z01818 Encounter for other preprocedural examination: Secondary | ICD-10-CM | POA: Insufficient documentation

## 2019-04-20 DIAGNOSIS — Z20822 Contact with and (suspected) exposure to covid-19: Secondary | ICD-10-CM | POA: Diagnosis not present

## 2019-04-20 DIAGNOSIS — J449 Chronic obstructive pulmonary disease, unspecified: Secondary | ICD-10-CM | POA: Insufficient documentation

## 2019-04-20 DIAGNOSIS — I498 Other specified cardiac arrhythmias: Secondary | ICD-10-CM | POA: Insufficient documentation

## 2019-04-20 LAB — CBC
HCT: 41.1 % (ref 39.0–52.0)
Hemoglobin: 12.6 g/dL — ABNORMAL LOW (ref 13.0–17.0)
MCH: 27.5 pg (ref 26.0–34.0)
MCHC: 30.7 g/dL (ref 30.0–36.0)
MCV: 89.5 fL (ref 80.0–100.0)
Platelets: 264 10*3/uL (ref 150–400)
RBC: 4.59 MIL/uL (ref 4.22–5.81)
RDW: 15.7 % — ABNORMAL HIGH (ref 11.5–15.5)
WBC: 8.2 10*3/uL (ref 4.0–10.5)
nRBC: 0 % (ref 0.0–0.2)

## 2019-04-20 LAB — URINALYSIS, ROUTINE W REFLEX MICROSCOPIC
Bilirubin Urine: NEGATIVE
Glucose, UA: NEGATIVE mg/dL
Hgb urine dipstick: NEGATIVE
Ketones, ur: NEGATIVE mg/dL
Leukocytes,Ua: NEGATIVE
Nitrite: NEGATIVE
Protein, ur: NEGATIVE mg/dL
Specific Gravity, Urine: 1.009 (ref 1.005–1.030)
pH: 6 (ref 5.0–8.0)

## 2019-04-20 LAB — COMPREHENSIVE METABOLIC PANEL
ALT: 15 U/L (ref 0–44)
AST: 17 U/L (ref 15–41)
Albumin: 3.8 g/dL (ref 3.5–5.0)
Alkaline Phosphatase: 54 U/L (ref 38–126)
Anion gap: 9 (ref 5–15)
BUN: 12 mg/dL (ref 8–23)
CO2: 28 mmol/L (ref 22–32)
Calcium: 9.4 mg/dL (ref 8.9–10.3)
Chloride: 102 mmol/L (ref 98–111)
Creatinine, Ser: 0.86 mg/dL (ref 0.61–1.24)
GFR calc Af Amer: >60 mL/min (ref >60)
GFR calc non Af Amer: >60 mL/min (ref >60)
Glucose, Bld: 99 mg/dL (ref 70–99)
Potassium: 4 mmol/L (ref 3.5–5.1)
Sodium: 139 mmol/L (ref 135–145)
Total Bilirubin: 0.8 mg/dL (ref 0.3–1.2)
Total Protein: 7 g/dL (ref 6.5–8.1)

## 2019-04-20 LAB — SURGICAL PCR SCREEN
MRSA, PCR: NEGATIVE
Staphylococcus aureus: NEGATIVE

## 2019-04-20 NOTE — Progress Notes (Signed)
Your procedure is scheduled on Friday, February 26th.  Report to [facility] Main Entrance "A" at 8:00 A.M., and check in at the Admitting office.  Call this number if you have problems the morning of surgery:  [phone]  Call [phone] if you have any questions prior to your surgery date Monday-Friday 8am-4pm    Remember:  Do not eat after midnight the night before your surgery  You may drink clear liquids until 7:00 AM the morning of your surgery.   Clear liquids allowed are: Water, Non-Citrus Juices (without pulp), Carbonated Beverages, Clear Tea, Black Coffee Only, and Gatorade.  Please complete your PRE-SURGERY ENSURE that was provided to you by 0700 AM the morning of surgery.  Please, if able, drink it in one setting. DO NOT SIP.     Take these medicines the morning of surgery with A SIP OF WATER   NONE  As of today, STOP taking any Aspirin (unless otherwise instructed by your surgeon), Aleve, Naproxen, Ibuprofen, Motrin, Advil, Goody's, BC's, all herbal medications, fish oil, and all vitamins.    The Morning of Surgery  Do not wear jewelry.  Do not wear lotions, powders, colognes, or deodorant  Men may shave face and neck.  Do not bring valuables to the hospital.  [facility] is not responsible for any belongings or valuables.  If you are a smoker, DO NOT Smoke 24 hours prior to surgery  If you wear a CPAP at night please bring your mask the morning of surgery   Remember that you must have someone to transport you home after your surgery, and remain with you for 24 hours if you are discharged the same day.   Please bring cases for contacts, glasses, hearing aids, dentures or bridgework because it cannot be worn into surgery.    Leave your suitcase in the car.  After surgery it may be brought to your room.  For patients admitted to the hospital, discharge time will be determined by your treatment team.  Patients discharged the day of surgery will not be  allowed to drive home.    Special instructions:   [facility]- Preparing For Surgery  Before surgery, you can play an important role. Because skin is not sterile, your skin needs to be as free of germs as possible. You can reduce the number of germs on your skin by washing with CHG (chlorahexidine gluconate) Soap before surgery.  CHG is an antiseptic cleaner which kills germs and bonds with the skin to continue killing germs even after washing.    Oral Hygiene is also important to reduce your risk of infection.  Remember - BRUSH YOUR TEETH THE MORNING OF SURGERY WITH YOUR REGULAR TOOTHPASTE  Please do not use if you have an allergy to CHG or antibacterial soaps. If your skin becomes reddened/irritated stop using the CHG.  Do not shave (including legs and underarms) for at least 48 hours prior to first CHG shower. It is OK to shave your face.  Please follow these instructions carefully.   1. Shower the NIGHT BEFORE SURGERY and the MORNING OF SURGERY with CHG Soap.   2. If you chose to wash your hair, wash your hair first as usual with your normal shampoo.  3. After you shampoo, rinse your hair and body thoroughly to remove the shampoo.  4. Use CHG as you would any other liquid soap. You can apply CHG directly to the skin and wash gently with a scrungie or a clean washcloth.  5. Apply the CHG Soap to your body ONLY FROM THE NECK DOWN.  Do not use on open wounds or open sores. Avoid contact with your eyes, ears, mouth and genitals (private parts). Wash Face and genitals (private parts)  with your normal soap.   6. Wash thoroughly, paying special attention to the area where your surgery will be performed.  7. Thoroughly rinse your body with warm water from the neck down.  8. DO NOT shower/wash with your normal soap after using and rinsing off the CHG Soap.  9. Pat yourself dry with a CLEAN TOWEL.  10. Wear CLEAN PAJAMAS to bed the night before surgery, wear comfortable clothes the  morning of surgery  11. Place CLEAN SHEETS on your bed the night of your first shower and DO NOT SLEEP WITH PETS.    Day of Surgery:  Please shower the morning of surgery with the CHG soap Do not apply any deodorants/lotions. Please wear clean clothes to the hospital/surgery center.   Remember to brush your teeth WITH YOUR REGULAR TOOTHPASTE.   Please read over the following fact sheets that you were given.

## 2019-04-20 NOTE — Progress Notes (Addendum)
PCP - Stoney Bang, MD Cardiologist - Denies  PPM/ICD - Denies  Chest x-ray - 04/20/19 EKG - 04/20/19 Stress Test - Denies ECHO - Denies Cardiac Cath - Denies  Sleep Study - Denies   Patient denies being a diabetic.  Blood Thinner Instructions: N/A Aspirin Instructions: N/A  ERAS Protcol - Yes PRE-SURGERY Ensure or G2- Ensure given COVID TEST- 04/20/19   Anesthesia review: Yes, abnormal EKG.  Patient denies shortness of breath, fever, cough and chest pain at PAT appointment   All instructions explained to the patient, with a verbal understanding of the material. Patient agrees to go over the instructions while at home for a better understanding. Patient also instructed to self quarantine after being tested for COVID-19. The opportunity to ask questions was provided.

## 2019-04-21 LAB — SARS CORONAVIRUS 2 (TAT 6-24 HRS): SARS Coronavirus 2: NEGATIVE

## 2019-04-22 ENCOUNTER — Telehealth: Payer: Self-pay | Admitting: *Deleted

## 2019-04-22 NOTE — Care Plan (Signed)
RNCM call to patient to discuss [name] with Dr. Lorin Mercy on 04/23/19. Patient is an Ortho bundle patient through [facility]. Reviewed this and patient agreeable to participation and CM assistance. He will be staying with his brother after discharge (address: [address] [place], [place]). He needs a FWW and 3in1 for use at home. Referral made to [facility] and DME to be delivered to his hospital room. Anticipate HHPT needs after short hospital stay. Choice provided and referral made to [facility]. OPPT will be set up for [facility] in [place]. F/U scheduled with Dr. Lorin Mercy in [place] office on 05/06/19. Will continue to follow for CM needs.

## 2019-04-22 NOTE — Telephone Encounter (Signed)
Ortho bundle pre-op call completed. 

## 2019-04-23 ENCOUNTER — Encounter (HOSPITAL_COMMUNITY)
Admission: RE | Disposition: A | Discharge status: Home / Self Care | Payer: Self-pay | Source: Home / Self Care | Attending: Orthopaedic Surgery

## 2019-04-23 ENCOUNTER — Observation Stay (HOSPITAL_COMMUNITY)
Admission: RE | Admit: 2019-04-23 | Discharge: 2019-04-25 | Disposition: A | Discharge status: Home / Self Care | Payer: Medicare HMO | Attending: Orthopaedic Surgery | Admitting: Orthopaedic Surgery

## 2019-04-23 ENCOUNTER — Encounter (HOSPITAL_COMMUNITY): Payer: Self-pay | Admitting: Orthopaedic Surgery

## 2019-04-23 ENCOUNTER — Other Ambulatory Visit: Payer: Self-pay

## 2019-04-23 ENCOUNTER — Ambulatory Visit (HOSPITAL_COMMUNITY): Payer: Medicare HMO | Admitting: Anesthesiology

## 2019-04-23 ENCOUNTER — Ambulatory Visit (HOSPITAL_COMMUNITY): Payer: Medicare HMO | Admitting: Physician Assistant

## 2019-04-23 DIAGNOSIS — Z87891 Personal history of nicotine dependence: Secondary | ICD-10-CM | POA: Diagnosis not present

## 2019-04-23 DIAGNOSIS — Z8546 Personal history of malignant neoplasm of prostate: Secondary | ICD-10-CM | POA: Insufficient documentation

## 2019-04-23 DIAGNOSIS — E669 Obesity, unspecified: Secondary | ICD-10-CM | POA: Diagnosis not present

## 2019-04-23 DIAGNOSIS — G8918 Other acute postprocedural pain: Secondary | ICD-10-CM | POA: Diagnosis not present

## 2019-04-23 DIAGNOSIS — G8929 Other chronic pain: Secondary | ICD-10-CM | POA: Insufficient documentation

## 2019-04-23 DIAGNOSIS — Z6825 Body mass index (BMI) 25.0-25.9, adult: Secondary | ICD-10-CM | POA: Insufficient documentation

## 2019-04-23 DIAGNOSIS — M1712 Unilateral primary osteoarthritis, left knee: Principal | ICD-10-CM | POA: Diagnosis present

## 2019-04-23 DIAGNOSIS — Z7982 Long term (current) use of aspirin: Secondary | ICD-10-CM | POA: Insufficient documentation

## 2019-04-23 DIAGNOSIS — J449 Chronic obstructive pulmonary disease, unspecified: Secondary | ICD-10-CM | POA: Diagnosis not present

## 2019-04-23 HISTORY — PX: TOTAL KNEE ARTHROPLASTY: SHX125

## 2019-04-23 SURGERY — ARTHROPLASTY, KNEE, TOTAL
Anesthesia: Regional | Site: Knee | Laterality: Left

## 2019-04-23 MED ORDER — OXYCODONE-ACETAMINOPHEN 5-325 MG PO TABS: 1.0000 | ORAL_TABLET | Freq: Four times a day (QID) | ORAL | 0 refills | Status: DC | PRN

## 2019-04-23 MED ORDER — TRANEXAMIC ACID-NACL 1000-0.7 MG/100ML-% IV SOLN
INTRAVENOUS | Status: DC | PRN
  Administered 2019-04-23: 1000 mg via INTRAVENOUS

## 2019-04-23 MED ORDER — MIDAZOLAM HCL 2 MG/2ML IJ SOLN: 1.0000 mg | Freq: Once | INTRAMUSCULAR | Status: AC

## 2019-04-23 MED ORDER — SODIUM CHLORIDE 0.9 % IR SOLN
Status: DC | PRN
  Administered 2019-04-23: 3000 mL

## 2019-04-23 MED ORDER — 0.9 % SODIUM CHLORIDE (POUR BTL) OPTIME
TOPICAL | Status: DC | PRN
  Administered 2019-04-23: 1000 mL

## 2019-04-23 MED ORDER — EPHEDRINE SULFATE-NACL 50-0.9 MG/10ML-% IV SOSY
PREFILLED_SYRINGE | INTRAVENOUS | Status: DC | PRN
  Administered 2019-04-23: 10 mg via INTRAVENOUS

## 2019-04-23 MED ORDER — MIDAZOLAM HCL 2 MG/2ML IJ SOLN
INTRAMUSCULAR | Status: AC
  Filled 2019-04-23: qty 2

## 2019-04-23 MED ORDER — BUPIVACAINE HCL (PF) 0.25 % IJ SOLN
INTRAMUSCULAR | Status: AC
  Filled 2019-04-23: qty 30

## 2019-04-23 MED ORDER — METOCLOPRAMIDE HCL 5 MG PO TABS: 5.0000 mg | ORAL_TABLET | Freq: Three times a day (TID) | ORAL | Status: DC | PRN

## 2019-04-23 MED ORDER — ASPIRIN EC 325 MG PO TBEC
325.0000 mg | DELAYED_RELEASE_TABLET | Freq: Every day | ORAL | Status: DC
  Administered 2019-04-24 – 2019-04-25 (×2): 325 mg via ORAL
  Filled 2019-04-23 (×2): qty 1

## 2019-04-23 MED ORDER — METHOCARBAMOL 500 MG PO TABS
500.0000 mg | ORAL_TABLET | Freq: Four times a day (QID) | ORAL | Status: DC | PRN
  Administered 2019-04-24 (×2): 500 mg via ORAL
  Filled 2019-04-23 (×2): qty 1

## 2019-04-23 MED ORDER — HYDROMORPHONE HCL 1 MG/ML IJ SOLN: 0.5000 mg | INTRAMUSCULAR | Status: DC | PRN

## 2019-04-23 MED ORDER — SODIUM CHLORIDE 0.9 % IV SOLN: INTRAVENOUS | Status: DC

## 2019-04-23 MED ORDER — PROPOFOL 10 MG/ML IV BOLUS
INTRAVENOUS | Status: DC | PRN
  Administered 2019-04-23 (×3): 10 mg via INTRAVENOUS

## 2019-04-23 MED ORDER — PHENYLEPHRINE HCL-NACL 10-0.9 MG/250ML-% IV SOLN
INTRAVENOUS | Status: DC | PRN
  Administered 2019-04-23: 20 ug/min via INTRAVENOUS

## 2019-04-23 MED ORDER — OXYCODONE HCL 5 MG PO TABS
5.0000 mg | ORAL_TABLET | ORAL | Status: DC | PRN
  Administered 2019-04-23 – 2019-04-24 (×3): 10 mg via ORAL
  Administered 2019-04-24: 5 mg via ORAL
  Administered 2019-04-24 – 2019-04-25 (×2): 10 mg via ORAL
  Filled 2019-04-23 (×4): qty 2
  Filled 2019-04-23: qty 1
  Filled 2019-04-23: qty 2

## 2019-04-23 MED ORDER — POLYETHYLENE GLYCOL 3350 17 G PO PACK
17.0000 g | PACK | Freq: Every day | ORAL | Status: DC | PRN
  Administered 2019-04-25: 08:00:00 17 g via ORAL
  Filled 2019-04-23: qty 1

## 2019-04-23 MED ORDER — FENTANYL CITRATE (PF) 100 MCG/2ML IJ SOLN: 50.0000 ug | Freq: Once | INTRAMUSCULAR | Status: AC

## 2019-04-23 MED ORDER — PROPOFOL 500 MG/50ML IV EMUL
INTRAVENOUS | Status: DC | PRN
  Administered 2019-04-23: 100 ug/kg/min via INTRAVENOUS

## 2019-04-23 MED ORDER — ASPIRIN 325 MG PO TBEC: 325.0000 mg | DELAYED_RELEASE_TABLET | Freq: Every day | ORAL | 0 refills | Status: AC

## 2019-04-23 MED ORDER — FENTANYL CITRATE (PF) 100 MCG/2ML IJ SOLN
INTRAMUSCULAR | Status: AC
  Administered 2019-04-23: 09:00:00 50 ug via INTRAVENOUS
  Filled 2019-04-23: qty 2

## 2019-04-23 MED ORDER — MIDAZOLAM HCL 2 MG/2ML IJ SOLN
INTRAMUSCULAR | Status: AC
  Administered 2019-04-23: 1 mg via INTRAVENOUS
  Filled 2019-04-23: qty 2

## 2019-04-23 MED ORDER — ONDANSETRON HCL 4 MG/2ML IJ SOLN
INTRAMUSCULAR | Status: AC
  Filled 2019-04-23: qty 2

## 2019-04-23 MED ORDER — ONDANSETRON HCL 4 MG PO TABS: 4.0000 mg | ORAL_TABLET | Freq: Four times a day (QID) | ORAL | Status: DC | PRN

## 2019-04-23 MED ORDER — FENTANYL CITRATE (PF) 250 MCG/5ML IJ SOLN
INTRAMUSCULAR | Status: AC
  Filled 2019-04-23: qty 5

## 2019-04-23 MED ORDER — PHENOL 1.4 % MT LIQD: 1.0000 | OROMUCOSAL | Status: DC | PRN

## 2019-04-23 MED ORDER — ACETAMINOPHEN 500 MG PO TABS: 1000.0000 mg | ORAL_TABLET | Freq: Once | ORAL | Status: AC

## 2019-04-23 MED ORDER — DOCUSATE SODIUM 100 MG PO CAPS
100.0000 mg | ORAL_CAPSULE | Freq: Two times a day (BID) | ORAL | Status: DC
  Administered 2019-04-23 – 2019-04-25 (×4): 100 mg via ORAL
  Filled 2019-04-23 (×4): qty 1

## 2019-04-23 MED ORDER — METHOCARBAMOL 500 MG PO TABS: 500.0000 mg | ORAL_TABLET | Freq: Four times a day (QID) | ORAL | 0 refills | Status: AC | PRN

## 2019-04-23 MED ORDER — TRANEXAMIC ACID-NACL 1000-0.7 MG/100ML-% IV SOLN
INTRAVENOUS | Status: AC
  Filled 2019-04-23: qty 100

## 2019-04-23 MED ORDER — ONDANSETRON HCL 4 MG/2ML IJ SOLN
INTRAMUSCULAR | Status: DC | PRN
  Administered 2019-04-23: 4 mg via INTRAVENOUS

## 2019-04-23 MED ORDER — ONDANSETRON HCL 4 MG/2ML IJ SOLN: 4.0000 mg | Freq: Four times a day (QID) | INTRAMUSCULAR | Status: DC | PRN

## 2019-04-23 MED ORDER — ACETAMINOPHEN 325 MG PO TABS: 325.0000 mg | ORAL_TABLET | Freq: Four times a day (QID) | ORAL | Status: DC | PRN

## 2019-04-23 MED ORDER — LACTATED RINGERS IV SOLN: INTRAVENOUS | Status: DC

## 2019-04-23 MED ORDER — BUPIVACAINE HCL (PF) 0.25 % IJ SOLN
INTRAMUSCULAR | Status: DC | PRN
  Administered 2019-04-23: 30 mL

## 2019-04-23 MED ORDER — METHOCARBAMOL 1000 MG/10ML IJ SOLN
500.0000 mg | Freq: Four times a day (QID) | INTRAVENOUS | Status: DC | PRN
  Filled 2019-04-23: qty 5

## 2019-04-23 MED ORDER — CHLORHEXIDINE GLUCONATE 4 % EX LIQD: 60.0000 mL | Freq: Once | CUTANEOUS | Status: DC

## 2019-04-23 MED ORDER — FENTANYL CITRATE (PF) 100 MCG/2ML IJ SOLN: 25.0000 ug | INTRAMUSCULAR | Status: DC | PRN

## 2019-04-23 MED ORDER — MIDAZOLAM HCL 5 MG/5ML IJ SOLN
INTRAMUSCULAR | Status: DC | PRN
  Administered 2019-04-23: 1 mg via INTRAVENOUS

## 2019-04-23 MED ORDER — ACETAMINOPHEN 500 MG PO TABS
ORAL_TABLET | ORAL | Status: AC
  Administered 2019-04-23: 1000 mg via ORAL
  Filled 2019-04-23: qty 2

## 2019-04-23 MED ORDER — CEFAZOLIN SODIUM-DEXTROSE 2-4 GM/100ML-% IV SOLN
2.0000 g | INTRAVENOUS | Status: AC
  Administered 2019-04-23: 2 g via INTRAVENOUS
  Filled 2019-04-23: qty 100

## 2019-04-23 MED ORDER — BUPIVACAINE LIPOSOME 1.3 % IJ SUSP
20.0000 mL | Freq: Once | INTRAMUSCULAR | Status: DC
  Filled 2019-04-23: qty 20

## 2019-04-23 MED ORDER — MENTHOL 3 MG MT LOZG: 1.0000 | LOZENGE | OROMUCOSAL | Status: DC | PRN

## 2019-04-23 MED ORDER — EPHEDRINE 5 MG/ML INJ
INTRAVENOUS | Status: AC
  Filled 2019-04-23: qty 10

## 2019-04-23 MED ORDER — METOCLOPRAMIDE HCL 5 MG/ML IJ SOLN: 5.0000 mg | Freq: Three times a day (TID) | INTRAMUSCULAR | Status: DC | PRN

## 2019-04-23 MED ORDER — BUPIVACAINE LIPOSOME 1.3 % IJ SUSP
INTRAMUSCULAR | Status: DC | PRN
  Administered 2019-04-23: 20 mL

## 2019-04-23 SURGICAL SUPPLY — 70 items
ATTUNE MED DOME PAT 41 KNEE (Knees) ×2 IMPLANT
ATTUNE PS FEM LT SZ 7 CEM KNEE (Femur) ×2 IMPLANT
ATTUNE PSRP INSR SZ7 6 KNEE (Insert) ×2 IMPLANT
BANDAGE ESMARK 6X9 LF (GAUZE/BANDAGES/DRESSINGS) ×1 IMPLANT
BASE TIBIAL ROT PLAT SZ 8 KNEE (Knees) ×1 IMPLANT
BENZOIN TINCTURE PRP APPL 2/3 (GAUZE/BANDAGES/DRESSINGS) ×2 IMPLANT
BLADE SAGITTAL 25.0X1.19X90 (BLADE) ×2 IMPLANT
BLADE SAW SGTL 13X75X1.27 (BLADE) ×2 IMPLANT
BNDG ELASTIC 4X5.8 VLCR STR LF (GAUZE/BANDAGES/DRESSINGS) ×2 IMPLANT
BNDG ELASTIC 6X5.8 VLCR STR LF (GAUZE/BANDAGES/DRESSINGS) ×2 IMPLANT
BNDG ESMARK 6X9 LF (GAUZE/BANDAGES/DRESSINGS) ×2
BOWL SMART MIX CTS (DISPOSABLE) ×2 IMPLANT
CEMENT HV SMART SET (Cement) ×4 IMPLANT
COVER SURGICAL LIGHT HANDLE (MISCELLANEOUS) ×2 IMPLANT
COVER WAND RF STERILE (DRAPES) ×2 IMPLANT
CUFF TOURN SGL QUICK 34 (TOURNIQUET CUFF) ×1
CUFF TOURN SGL QUICK 42 (TOURNIQUET CUFF) IMPLANT
CUFF TRNQT CYL 34X4.125X (TOURNIQUET CUFF) ×1 IMPLANT
DRAPE ORTHO SPLIT 77X108 STRL (DRAPES) ×2
DRAPE SURG ORHT 6 SPLT 77X108 (DRAPES) ×2 IMPLANT
DRAPE U-SHAPE 47X51 STRL (DRAPES) ×2 IMPLANT
DRSG PAD ABDOMINAL 8X10 ST (GAUZE/BANDAGES/DRESSINGS) ×4 IMPLANT
DURAPREP 26ML APPLICATOR (WOUND CARE) ×4 IMPLANT
ELECT REM PT RETURN 9FT ADLT (ELECTROSURGICAL) ×2
ELECTRODE REM PT RTRN 9FT ADLT (ELECTROSURGICAL) ×1 IMPLANT
EVACUATOR 1/8 PVC DRAIN (DRAIN) IMPLANT
FACESHIELD WRAPAROUND (MASK) ×4 IMPLANT
GAUZE SPONGE 4X4 12PLY STRL (GAUZE/BANDAGES/DRESSINGS) ×2 IMPLANT
GAUZE XEROFORM 5X9 LF (GAUZE/BANDAGES/DRESSINGS) ×2 IMPLANT
GLOVE BIOGEL PI IND STRL 8 (GLOVE) ×2 IMPLANT
GLOVE BIOGEL PI INDICATOR 8 (GLOVE) ×2
GLOVE ORTHO TXT STRL SZ7.5 (GLOVE) ×4 IMPLANT
GOWN STRL REUS W/ TWL LRG LVL3 (GOWN DISPOSABLE) ×1 IMPLANT
GOWN STRL REUS W/ TWL XL LVL3 (GOWN DISPOSABLE) ×1 IMPLANT
GOWN STRL REUS W/TWL 2XL LVL3 (GOWN DISPOSABLE) ×2 IMPLANT
GOWN STRL REUS W/TWL LRG LVL3 (GOWN DISPOSABLE) ×1
GOWN STRL REUS W/TWL XL LVL3 (GOWN DISPOSABLE) ×1
HANDPIECE INTERPULSE COAX TIP (DISPOSABLE) ×1
IMMOBILIZER KNEE 22 UNIV (SOFTGOODS) ×2 IMPLANT
KIT BASIN OR (CUSTOM PROCEDURE TRAY) ×2 IMPLANT
KIT TURNOVER KIT B (KITS) ×2 IMPLANT
MANIFOLD NEPTUNE II (INSTRUMENTS) ×2 IMPLANT
MARKER SKIN DUAL TIP RULER LAB (MISCELLANEOUS) ×2 IMPLANT
NEEDLE 18GX1X1/2 (RX/OR ONLY) (NEEDLE) ×2 IMPLANT
NEEDLE HYPO 25GX1X1/2 BEV (NEEDLE) ×2 IMPLANT
NS IRRIG 1000ML POUR BTL (IV SOLUTION) ×2 IMPLANT
PACK TOTAL JOINT (CUSTOM PROCEDURE TRAY) ×2 IMPLANT
PAD ARMBOARD 7.5X6 YLW CONV (MISCELLANEOUS) ×4 IMPLANT
PAD CAST 4YDX4 CTTN HI CHSV (CAST SUPPLIES) ×1 IMPLANT
PADDING CAST COTTON 4X4 STRL (CAST SUPPLIES) ×1
PADDING CAST COTTON 6X4 STRL (CAST SUPPLIES) ×4 IMPLANT
PIN DRILL FIX HALF THREAD (BIT) ×2 IMPLANT
PIN STEINMAN FIXATION KNEE (PIN) ×2 IMPLANT
SET HNDPC FAN SPRY TIP SCT (DISPOSABLE) ×1 IMPLANT
STAPLER VISISTAT 35W (STAPLE) IMPLANT
SUCTION FRAZIER HANDLE 10FR (MISCELLANEOUS) ×1
SUCTION TUBE FRAZIER 10FR DISP (MISCELLANEOUS) ×1 IMPLANT
SUT VIC AB 0 CT1 27 (SUTURE) ×1
SUT VIC AB 0 CT1 27XBRD ANBCTR (SUTURE) ×1 IMPLANT
SUT VIC AB 1 CTX 36 (SUTURE) ×2
SUT VIC AB 1 CTX36XBRD ANBCTR (SUTURE) ×2 IMPLANT
SUT VIC AB 2-0 CT1 27 (SUTURE) ×2
SUT VIC AB 2-0 CT1 TAPERPNT 27 (SUTURE) ×2 IMPLANT
SUT VIC AB 3-0 X1 27 (SUTURE) ×2 IMPLANT
SYR 50ML LL SCALE MARK (SYRINGE) ×4 IMPLANT
SYR CONTROL 10ML LL (SYRINGE) ×2 IMPLANT
TIBIAL BASE ROT PLAT SZ 8 KNEE (Knees) ×2 IMPLANT
TOWEL GREEN STERILE (TOWEL DISPOSABLE) ×2 IMPLANT
TOWEL GREEN STERILE FF (TOWEL DISPOSABLE) ×2 IMPLANT
TRAY CATH 16FR W/PLASTIC CATH (SET/KITS/TRAYS/PACK) IMPLANT

## 2019-04-23 NOTE — Anesthesia Preprocedure Evaluation (Addendum)
Anesthesia Evaluation  Patient identified by MRN, date of birth, ID band Patient awake    Reviewed: Allergy & Precautions, NPO status , Patient's Chart, lab work & pertinent test results  Airway Mallampati: II  TM Distance: >3 FB Neck ROM: Full    Dental no notable dental hx. (+) Teeth Intact, Dental Advisory Given   Pulmonary neg pulmonary ROS, former smoker,    Pulmonary exam normal breath sounds clear to auscultation       Cardiovascular negative cardio ROS Normal cardiovascular exam Rhythm:Regular Rate:Normal     Neuro/Psych negative neurological ROS  negative psych ROS   GI/Hepatic negative GI ROS, Neg liver ROS,   Endo/Other  negative endocrine ROS  Renal/GU negative Renal ROS  negative genitourinary   Musculoskeletal  (+) Arthritis ,   Abdominal   Peds  Hematology negative hematology ROS (+)   Anesthesia Other Findings   Reproductive/Obstetrics                            Anesthesia Physical Anesthesia Plan  ASA: II  Anesthesia Plan: Spinal and Regional   Post-op Pain Management:  Regional for Post-op pain   Induction:   PONV Risk Score and Plan: 1 and Treatment may vary due to age or medical condition, Propofol infusion and Midazolam  Airway Management Planned: Natural Airway  Additional Equipment:   Intra-op Plan:   Post-operative Plan:   Informed Consent: I have reviewed the patients History and Physical, chart, labs and discussed the procedure including the risks, benefits and alternatives for the proposed anesthesia with the patient or authorized representative who has indicated his/her understanding and acceptance.     Dental advisory given  Plan Discussed with: CRNA  Anesthesia Plan Comments:         Anesthesia Quick Evaluation

## 2019-04-23 NOTE — Discharge Instructions (Addendum)
INSTRUCTIONS AFTER JOINT REPLACEMENT   o Remove items at home which could result in a fall. This includes throw rugs or furniture in walking pathways o ICE to the affected joint every three hours while awake for 30 minutes at a time, for at least the first 3-5 days, and then as needed for pain and swelling.  Continue to use ice for pain and swelling. You may notice swelling that will progress down to the foot and ankle.  This is normal after surgery.  Elevate your leg when you are not up walking on it.   o Continue to use the breathing machine you got in the hospital (incentive spirometer) which will help keep your temperature down.  It is common for your temperature to cycle up and down following surgery, especially at night when you are not up moving around and exerting yourself.  The breathing machine keeps your lungs expanded and your temperature down.   DIET:  As you were doing prior to hospitalization, we recommend a well-balanced diet.  DRESSING / WOUND CARE / SHOWERING Leave dressing on it is water proof. You may shower with dressing on.    ACTIVITY  o Increase activity slowly as tolerated, but follow the weight bearing instructions below.   o No driving for 6 weeks or until further direction given by your physician.  You cannot drive while taking narcotics.  o No lifting or carrying greater than 10 lbs. until further directed by your surgeon. o Avoid periods of inactivity such as sitting longer than an hour when not asleep. This helps prevent blood clots.  o You may return to work once you are authorized by your doctor.     WEIGHT BEARING   Weight bearing as tolerated with assist device (walker, cane, etc) as directed, use it as long as suggested by your surgeon or therapist, typically at least 4-6 weeks.   EXERCISES  Results after joint replacement surgery are often greatly improved when you follow the exercise, range of motion and muscle strengthening exercises prescribed by  your doctor. Safety measures are also important to protect the joint from further injury. Any time any of these exercises cause you to have increased pain or swelling, decrease what you are doing until you are comfortable again and then slowly increase them. If you have problems or questions, call your caregiver or physical therapist for advice.   Rehabilitation is important following a joint replacement. After just a few days of immobilization, the muscles of the leg can become weakened and shrink (atrophy).  These exercises are designed to build up the tone and strength of the thigh and leg muscles and to improve motion. Often times heat used for twenty to thirty minutes before working out will loosen up your tissues and help with improving the range of motion but do not use heat for the first two weeks following surgery (sometimes heat can increase post-operative swelling).   These exercises can be done on a training (exercise) mat, on the floor, on a table or on a bed. Use whatever works the best and is most comfortable for you.    Use music or television while you are exercising so that the exercises are a pleasant break in your day. This will make your life better with the exercises acting as a break in your routine that you can look forward to.   Perform all exercises about fifteen times, three times per day or as directed.  You should exercise both the operative leg  and the other leg as well.  Exercises include:   . Quad Sets - Tighten up the muscle on the front of the thigh (Quad) and hold for 5-10 seconds.   . Straight Leg Raises - With your knee straight (if you were given a brace, keep it on), lift the leg to 60 degrees, hold for 3 seconds, and slowly lower the leg.  Perform this exercise against resistance later as your leg gets stronger.  . Leg Slides: Lying on your back, slowly slide your foot toward your buttocks, bending your knee up off the floor (only go as far as is comfortable). Then  slowly slide your foot back down until your leg is flat on the floor again.  Glenard Haring Wings: Lying on your back spread your legs to the side as far apart as you can without causing discomfort.  . Hamstring Strength:  Lying on your back, push your heel against the floor with your leg straight by tightening up the muscles of your buttocks.  Repeat, but this time bend your knee to a comfortable angle, and push your heel against the floor.  You may put a pillow under the heel to make it more comfortable if necessary.   A rehabilitation program following joint replacement surgery can speed recovery and prevent re-injury in the future due to weakened muscles. Contact your doctor or a physical therapist for more information on knee rehabilitation.    CONSTIPATION  Constipation is defined medically as fewer than three stools per week and severe constipation as less than one stool per week.  Even if you have a regular bowel pattern at home, your normal regimen is likely to be disrupted due to multiple reasons following surgery.  Combination of anesthesia, postoperative narcotics, change in appetite and fluid intake all can affect your bowels.   YOU MUST use at least one of the following options; they are listed in order of increasing strength to get the job done.  They are all available over the counter, and you may need to use some, POSSIBLY even all of these options:    Drink plenty of fluids (prune juice may be helpful) and high fiber foods Colace 100 mg by mouth twice a day  Senokot for constipation as directed and as needed Dulcolax (bisacodyl), take with full glass of water  Miralax (polyethylene glycol) once or twice a day as needed.  If you have tried all these things and are unable to have a bowel movement in the first 3-4 days after surgery call either your surgeon or your primary doctor.    If you experience loose stools or diarrhea, hold the medications until you stool forms back up.  If your  symptoms do not get better within 1 week or if they get worse, check with your doctor.  If you experience "the worst abdominal pain ever" or develop nausea or vomiting, please contact the office immediately for further recommendations for treatment.   ITCHING:  If you experience itching with your medications, try taking only a single pain pill, or even half a pain pill at a time.  You can also use Benadryl over the counter for itching or also to help with sleep.   TED HOSE STOCKINGS:  Use stockings on both legs until for at least 2 weeks or as directed by physician office. They may be removed at night for sleeping.  MEDICATIONS:  See your medication summary on the "After Visit Summary" that nursing will review with you.  You  may have some home medications which will be placed on hold until you complete the course of blood thinner medication.  It is important for you to complete the blood thinner medication as prescribed.  PRECAUTIONS:  If you experience chest pain or shortness of breath - call 911 immediately for transfer to the hospital emergency department.   If you develop a fever greater that 101 F, purulent drainage from wound, increased redness or drainage from wound, foul odor from the wound/dressing, or calf pain - CONTACT YOUR SURGEON.                                                   FOLLOW-UP APPOINTMENTS:  If you do not already have a post-op appointment, please call the office for an appointment to be seen by your surgeon.  Guidelines for how soon to be seen are listed in your "After Visit Summary", but are typically between 1-4 weeks after surgery.  OTHER INSTRUCTIONS:   Knee Replacement:  Do not place pillow under knee, focus on keeping the knee straight while resting. CPM instructions: 0-90 degrees, 2 hours in the morning, 2 hours in the afternoon, and 2 hours in the evening. Place foam block, curve side up under heel at all times except when in CPM or when walking.  DO NOT modify,  tear, cut, or change the foam block in any way.  MAKE SURE YOU:  . Understand these instructions.  . Get help right away if you are not doing well or get worse.    Thank you for letting us be a part of your medical care team.  It is a privilege we respect greatly.  We hope these instructions will help you stay on track for a fast and full recovery!

## 2019-04-23 NOTE — Anesthesia Procedure Notes (Addendum)
Anesthesia Regional Block: Adductor canal block   Pre-Anesthetic Checklist: ,, timeout performed, Correct Patient, Correct Site, Correct Laterality, Correct Procedure, Correct Position, site marked, Risks and benefits discussed,  Surgical consent,  Pre-op evaluation,  At surgeon's request and post-op pain management  Laterality: Left  Prep: Maximum Sterile Barrier Precautions used, chloraprep       Needles:  Injection technique: Single-shot  Needle Type: Echogenic Stimulator Needle     Needle Length: 9cm  Needle Gauge: 22     Additional Needles:   Procedures:,,,, ultrasound used (permanent image in chart),,,,  Narrative:  Start time: 04/23/2019 10:19 AM End time: 04/23/2019 10:29 AM Injection made incrementally with aspirations every 5 mL.  Performed by: Personally  Anesthesiologist: Freddrick March, MD  Additional Notes: Monitors applied. No increased pain on injection. No increased resistance to injection. Injection made in 5cc increments. Good needle visualization. Patient tolerated procedure well.

## 2019-04-23 NOTE — Op Note (Signed)
Preop diagnosis: Left knee primary osteoarthritis  Postop diagnosis: Same  Procedure: Left total knee arthroplasty.  Surgeon: Rodell Perna, MD  Anesthesia: Spinal plus preoperative block +20 cc Exparel 20 cc Marcaine at end of case.  Assistant: Benjiman Core, PA-C medically necessary and present for the entire procedure  Tourniquet: 350 x 47 minutes.  ImplantsDepuy attention size 7 femur size 8 tibial rotating platform.  41 mm patella 6 mm rotating platform poly-.  Procedure after induction of preoperative block spinal anesthesia proximal tourniquet heel bump lateral post prepping DuraPrep down to the toes was performed Ancef prophylaxis timeout procedure.  Usual total knee sheets drapes sterile skin marker Betadine Steri-Drape Esmarch and tourniquet inflation was performed.  Midline incision was made medial parapatellar incision.  Tricompartmental degenerative changes but most severe medial compartment with bone-on-bone changes.  Meniscus regiment were resected ACL PCL was resected.  Intramedullary hole drilled on the femur and 10 mm resected off the distal femur.  None on the tibia.  5 mm spacer block fit in and allowed slight extension we selected a 6 spacer block gave a nice fit.  Chamfer cuts made on the femur box cut keel preparation on the tibia which was sized to an 8.  Posterior spurs off the medial femoral condyle removed with a three-quarter curved osteotome.  Pulse lavage vacuum mixing of cement and cementing of components and in normal sequence.  Cement was hard at 15 minutes all excessive cement was removed knee came to full extension good collateral balance.  Tourniquet deflated after 15 minutes hemostasis obtained total tourniquet time 47 minutes.  Standard layered interrupted #1 Vicryl dyed suture closure 2-0 Vicryl in the superficial retinaculum subtenons tissue skin staple closure postop dressing and knee immobilizer patient tolerated procedure well transferred recovery room in stable  condition.

## 2019-04-23 NOTE — Plan of Care (Signed)

## 2019-04-23 NOTE — H&P (Signed)
Office Visit Note              Patient: Michael Contreras                                      Date of Birth: 1950/11/05                                                    MRN: [id number] Visit Date: 04/08/2019                                                                     Requested by: Neale Burly, MD [place],  [place] [id number] PCP: Neale Burly, MD   Assessment & Plan: Visit Diagnoses:  1. Chronic pain of left knee   2. Unilateral primary osteoarthritis, left knee     Plan: Patient is end-stage left knee osteoarthritis with full-thickness cartilage loss by MRI scan 07/10/2018.  Previous meniscectomies last year with progressive varus deformity.  He has had intra-articular injections he is to cane had Visco injections all without relief and would like to proceed with total knee arthroplasty.  We discussed overnight stay use of spinal, home physical therapy x2 weeks and then outpatient therapy for 2 to 3 weeks.  He lives alone but has a brother who lives close to him and he could stay with his brother after the procedure.  Questions elicited and answered.  He understands requests we proceed.  Follow-Up Instructions: No follow-ups on file.   Orders:     Orders Placed This Encounter  Procedures  . XR KNEE 3 VIEW LEFT   No orders of the defined types were placed in this encounter.     Procedures: No procedures performed   Clinical Data: No additional findings.   Subjective:    Chief Complaint  Patient presents with  . Left Knee - Pain    HPI 69 year old male with end-stage left knee osteoarthritis varus deformity.  Previous medial meniscectomy lateral meniscectomy by Dr.Case in May 2020 with persistent pain and catching.  He has had Baker's cyst persistent knee effusion had recent repeat aspiration of his knee and within few days recurrence of tense knee effusion and significant pain.  Patient is noticed progressive varus deformity  of his left knee minimal knee symptoms in his right knee.  Patient was referred by [facility] for consideration for left total knee arthroplasty.  Patient quit smoking 15 years ago.  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  Increased pain with ambulation pain with community ambulation has been treated with intra-articular injections Synvisc, cortisone, anti-inflammatories all without relief.  Review of Systems 14 point review of systems negative for stroke heart attack chest pain GI or kidney problems.  Past smoker quit 15 years ago.  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  He does not drink.   Objective: Vital Signs: BP 128/86   Pulse 76   Ht 6\' 2"  (1.88  m)   Wt 200 lb (90.7 kg)   BMI 25.68 kg/m   Physical Exam Constitutional:      Appearance: He is well-developed.  HENT:     Head: Normocephalic and atraumatic.  Eyes:     Pupils: Pupils are equal, round, and reactive to light.  Neck:     Thyroid: No thyromegaly.     Trachea: No tracheal deviation.  Cardiovascular:     Rate and Rhythm: Normal rate.  Pulmonary:     Effort: Pulmonary effort is normal.     Breath sounds: No wheezing.  Abdominal:     General: Bowel sounds are normal.     Palpations: Abdomen is soft.  Skin:    General: Skin is warm and dry.     Capillary Refill: Capillary refill takes less than 2 seconds.  Neurological:     Mental Status: He is alert and oriented to person, place, and time.  Psychiatric:        Behavior: Behavior normal.        Thought Content: Thought content normal.        Judgment: Judgment normal.     Ortho Exam patient has negative straight leg raising 90 degrees distal pulses are 2+ posterior tib 1+ dorsalis pedis.  Negative logroll to the hips no static notch tenderness.  He has varus deformity left knee and has lateral knee thrust with ambulation gait.  Knee comes within 4 degrees reaching full extension flexes 220 degrees  with crepitus medial joint line swelling and 3-4+ knee effusion with moderate-sized Baker's cyst.  Specialty Comments:  No specialty comments available.  Imaging: XR KNEE 3 VIEW LEFT  Result Date: 04/08/2019 Standing AP both knees lateral left knee sunrise patellar x-ray demonstrates bone-on-bone changes left knee medial compartment 10degrees varus deformity.  Marginal osteophytes subchondral sclerosis. Impression left knee osteoarthritis, moderate to severe with bone-on-bone medial compartment.    PMFS History:     Patient Active Problem List   Diagnosis Date Noted  . Unilateral primary osteoarthritis, left knee 04/08/2019  . Prostate cancer ([place]) 05/16/2016  . Abscess        Past Medical History:  Diagnosis Date  . Arthritis   . History of hiatal hernia    inguinal   . Prostate cancer ([place])     No family history on file.       Past Surgical History:  Procedure Laterality Date  . APPENDECTOMY    . HERNIA REPAIR    . LYMPHADENECTOMY Bilateral 05/16/2016   Procedure: PELVIC LYMPHADENECTOMY;  Surgeon: Raynelle Bring, MD;  Location: [facility]S;  Service: Urology;  Laterality: Bilateral;  . ROBOT ASSISTED LAPAROSCOPIC RADICAL PROSTATECTOMY N/A 05/16/2016   Procedure: XI ROBOTIC ASSISTED LAPAROSCOPIC RADICAL PROSTATECTOMY LEVEL 2;  Surgeon: Raynelle Bring, MD;  Location: [facility];  Service: Urology;  Laterality: N/A;   Social History        Occupational History  . Not on file  Tobacco Use  . Smoking status: Former Smoker    Packs/day: 2.00    Types: Cigarettes    Quit date: 01/05/2013    Years since quitting: 6.2  . Smokeless tobacco: Never Used  Substance and Sexual Activity  . Alcohol use: No  . Drug use: No  . Sexual activity: Not on file

## 2019-04-23 NOTE — Anesthesia Postprocedure Evaluation (Signed)
Anesthesia Post Note  Patient: LUAY CONROW  Procedure(s) Performed: LEFT TOTAL KNEE ARTHROPLASTY-CEMENTED (Left Knee)     Patient location during evaluation: PACU Anesthesia Type: Regional and Spinal Level of consciousness: oriented and awake and alert Pain management: pain level controlled Vital Signs Assessment: post-procedure vital signs reviewed and stable Respiratory status: spontaneous breathing, respiratory function stable and patient connected to nasal cannula oxygen Cardiovascular status: blood pressure returned to baseline and stable Postop Assessment: no headache, no backache and no apparent nausea or vomiting Anesthetic complications: no    Last Vitals:  Vitals:   04/23/19 1240 04/23/19 1255  BP: 114/70 117/70  Pulse: 60 64  Resp: 11 16  Temp:    SpO2: 99% 98%    Last Pain:  Vitals:   04/23/19 1240  TempSrc:   PainSc: 0-No pain                 Ladell Bey L Armen Waring

## 2019-04-23 NOTE — Transfer of Care (Signed)
Immediate Anesthesia Transfer of Care Note  Patient: Michael Contreras  Procedure(s) Performed: LEFT TOTAL KNEE ARTHROPLASTY-CEMENTED (Left Knee)  Patient Location: PACU  Anesthesia Type:MAC and Spinal  Level of Consciousness: awake, patient cooperative and responds to stimulation  Airway & Oxygen Therapy: Patient Spontanous Breathing  Post-op Assessment: Report given to RN and Post -op Vital signs reviewed and stable  Post vital signs: Reviewed and stable  Last Vitals:  Vitals Value Taken Time  BP 101/80 04/23/19 1203  Temp    Pulse 69 04/23/19 1207  Resp 12 04/23/19 1207  SpO2 85 % 04/23/19 1207  Vitals shown include unvalidated device data.  Last Pain:  Vitals:   04/23/19 0822  TempSrc: Oral  PainSc: 0-No pain         Complications: No apparent anesthesia complications

## 2019-04-23 NOTE — Interval H&P Note (Signed)
History and Physical Interval Note:  04/23/2019 9:23 AM  Michael Contreras  has presented today for surgery, with the diagnosis of left knee osteoarthritis.  The various methods of treatment have been discussed with the patient and family. After consideration of risks, benefits and other options for treatment, the patient has consented to  Procedure(s): LEFT TOTAL KNEE ARTHROPLASTY-CEMENTED (Left) as a surgical intervention.  The patient's history has been reviewed, patient examined, no change in status, stable for surgery.  I have reviewed the patient's chart and labs.  Questions were answered to the patient's satisfaction.     Marybelle Killings

## 2019-04-23 NOTE — Progress Notes (Signed)
Orthopedic Tech Progress Note Patient Details:  Michael Contreras Mar 21, 1950 [id number]  CPM Left Knee CPM Left Knee: On Left Knee Flexion (Degrees): 90 Left Knee Extension (Degrees): 0 Additional Comments: foot roll     [place] Pink 04/23/2019, 12:40 PM

## 2019-04-23 NOTE — Anesthesia Procedure Notes (Signed)
Spinal  Patient location during procedure: OR Start time: 04/23/2019 10:01 AM End time: 04/23/2019 10:11 AM Staffing Performed: anesthesiologist  Anesthesiologist: Freddrick March, MD Preanesthetic Checklist Completed: patient identified, IV checked, risks and benefits discussed, surgical consent, monitors and equipment checked, pre-op evaluation and timeout performed Spinal Block Patient position: sitting Prep: DuraPrep and site prepped and draped Patient monitoring: cardiac monitor, continuous pulse ox and blood pressure Approach: midline Location: L3-4 Injection technique: single-shot Needle Needle type: Pencan  Needle gauge: 24 G Needle length: 9 cm Assessment Sensory level: T6 Additional Notes Functioning IV was confirmed and monitors were applied. Sterile prep and drape, including hand hygiene and sterile gloves were used. The patient was positioned and the spine was prepped. The skin was anesthetized with lidocaine.  Free flow of clear CSF was obtained prior to injecting local anesthetic into the CSF.  The spinal needle aspirated freely following injection.  The needle was carefully withdrawn.  The patient tolerated the procedure well.

## 2019-04-23 NOTE — Evaluation (Signed)
Physical Therapy Evaluation Patient Details Name: Michael Contreras MRN: [id number] DOB: [birth year] Today's Date: 04/23/2019   History of Present Illness  Pt is a 69 y/o male s/p L TKA. PMH includes prostate cancer.   Clinical Impression  Pt is s/p surgery above with deficits below. Pt requiring min guard A for mobility using RW. Educated about knee precautions. Will continue to follow acutely to maximize functional mobility independence and safety.     Follow Up Recommendations Follow surgeon's recommendation for DC plan and follow-up therapies;Supervision for mobility/OOB    Equipment Recommendations  None recommended by PT    Recommendations for Other Services       Precautions / Restrictions Precautions Precautions: Knee Precaution Booklet Issued: No Restrictions Weight Bearing Restrictions: Yes LLE Weight Bearing: Weight bearing as tolerated      Mobility  Bed Mobility Overal bed mobility: Needs Assistance Bed Mobility: Supine to Sit     Supine to sit: Supervision     General bed mobility comments: Supervision for safety.   Transfers Overall transfer level: Needs assistance Equipment used: Rolling walker (2 wheeled) Transfers: Sit to/from Stand Sit to Stand: Min guard         General transfer comment: Min guard for safety. Cues for safe hand placement.   Ambulation/Gait Ambulation/Gait assistance: Min guard Gait Distance (Feet): 20 Feet Assistive device: Rolling walker (2 wheeled) Gait Pattern/deviations: Step-to pattern;Decreased step length - right;Decreased step length - left;Decreased weight shift to left;Antalgic Gait velocity: Decreased   General Gait Details: Slow, antalgic gait. Cues for sequencing using RW. Pt reporting some dizziness, which limited gait to within the room.   Stairs            Wheelchair Mobility    Modified Rankin (Stroke Patients Only)       Balance Overall balance assessment: Needs assistance Sitting-balance  support: No upper extremity supported;Feet supported Sitting balance-Leahy Scale: Good     Standing balance support: Bilateral upper extremity supported;During functional activity Standing balance-Leahy Scale: Poor Standing balance comment: Reliant on BUE support                              Pertinent Vitals/Pain Pain Assessment: 0-10 Pain Score: 3  Pain Location: L knee Pain Descriptors / Indicators: Aching;Operative site guarding Pain Intervention(s): Monitored during session;Limited activity within patient's tolerance;Repositioned    Home Living Family/patient expects to be discharged to:: Private residence Living Arrangements: Alone Available Help at Discharge: Family Type of Home: House Home Access: Stairs to enter Entrance Stairs-Rails: Chemical engineer of Steps: 2 Home Layout: Multi-level;Able to live on main level with bedroom/bathroom Home Equipment: Gilford Rile - 2 wheels;Bedside commode Additional Comments: PLan to stay with brother    Prior Function Level of Independence: Independent               Hand Dominance        Extremity/Trunk Assessment   Upper Extremity Assessment Upper Extremity Assessment: Defer to OT evaluation    Lower Extremity Assessment Lower Extremity Assessment: LLE deficits/detail LLE Deficits / Details: Deficits consistent with post op pain and weakness.        Communication   Communication: No difficulties  Cognition Arousal/Alertness: Awake/alert Behavior During Therapy: WFL for tasks assessed/performed Overall Cognitive Status: Within Functional Limits for tasks assessed  General Comments      Exercises Total Joint Exercises Ankle Circles/Pumps: AROM;Both;20 reps   Assessment/Plan    PT Assessment Patient needs continued PT services  PT Problem List Decreased strength;Decreased balance;Decreased mobility;Decreased range of  motion;Decreased knowledge of use of DME;Decreased knowledge of precautions;Pain       PT Treatment Interventions Stair training;Gait training;DME instruction;Functional mobility training;Therapeutic activities;Therapeutic exercise;Balance training;Patient/family education    PT Goals (Current goals can be found in the Care Plan section)  Acute Rehab PT Goals Patient Stated Goal: to go home PT Goal Formulation: With patient Time For Goal Achievement: 05/07/19 Potential to Achieve Goals: Good    Frequency 7X/week   Barriers to discharge        Co-evaluation               AM-PAC PT "6 Clicks" Mobility  Outcome Measure Help needed turning from your back to your side while in a flat bed without using bedrails?: None Help needed moving from lying on your back to sitting on the side of a flat bed without using bedrails?: A Little Help needed moving to and from a bed to a chair (including a wheelchair)?: A Little Help needed standing up from a chair using your arms (e.g., wheelchair or bedside chair)?: A Little Help needed to walk in hospital room?: A Little Help needed climbing 3-5 steps with a railing? : A Lot 6 Click Score: 18    End of Session Equipment Utilized During Treatment: Gait belt;Left knee immobilizer Activity Tolerance: Patient tolerated treatment well Patient left: in chair;with call bell/phone within reach Nurse Communication: Mobility status PT Visit Diagnosis: Other abnormalities of gait and mobility (R26.89);Pain Pain - Right/Left: Left Pain - part of body: Knee    Time: [id number] PT Time Calculation (min) (ACUTE ONLY): 25 min   Charges:   PT Evaluation $PT Eval Low Complexity: 1 Low PT Treatments $Gait Training: 8-22 mins        Michael Contreras, DPT  Acute Rehabilitation Services  Pager: [phone] Office: [phone]   Michael Contreras 04/23/2019, 5:54 PM

## 2019-04-23 NOTE — Care Plan (Signed)
Ortho Bundle Case Management Note  Patient Details  Name: Michael Contreras MRN: [id number] Date of Birth: [birth year]     [facility] call to patient to discuss [name] with Dr. Lorin Mercy on 04/23/19. Patient is an Ortho bundle patient through [facility]. Reviewed this and patient agreeable to participation and CM assistance. He will be staying with his brother after discharge (address: [address] [place], [place]). He needs a FWW and 3in1 for use at home. Referral made to [place] and DME to be delivered to his hospital room. Anticipate HHPT needs after short hospital stay. Choice provided and referral made to [facility]. OPPT will be set up for [facility] in [place]. F/U scheduled with Dr. Lorin Mercy in [place] office on 05/06/19. Will continue to follow for CM needs.                       DME Arranged:  3-N-1, Walker rolling DME Agency:  [employer](Will be delivered to patient's hospital bed prior to discharge home)  [place] Arranged:  PT [place]:  [facility] (now [facility])  Additional Comments: Please contact me with any questions of if this plan should need to change.  Jamse Arn, RN, BSN, [employer]  [phone] 04/23/2019, 9:58 AM

## 2019-04-24 DIAGNOSIS — Z7982 Long term (current) use of aspirin: Secondary | ICD-10-CM | POA: Diagnosis not present

## 2019-04-24 DIAGNOSIS — G8929 Other chronic pain: Secondary | ICD-10-CM | POA: Diagnosis not present

## 2019-04-24 DIAGNOSIS — Z8546 Personal history of malignant neoplasm of prostate: Secondary | ICD-10-CM | POA: Diagnosis not present

## 2019-04-24 DIAGNOSIS — Z87891 Personal history of nicotine dependence: Secondary | ICD-10-CM | POA: Diagnosis not present

## 2019-04-24 DIAGNOSIS — Z6825 Body mass index (BMI) 25.0-25.9, adult: Secondary | ICD-10-CM | POA: Diagnosis not present

## 2019-04-24 DIAGNOSIS — M1712 Unilateral primary osteoarthritis, left knee: Secondary | ICD-10-CM | POA: Diagnosis not present

## 2019-04-24 DIAGNOSIS — J449 Chronic obstructive pulmonary disease, unspecified: Secondary | ICD-10-CM | POA: Diagnosis not present

## 2019-04-24 DIAGNOSIS — E669 Obesity, unspecified: Secondary | ICD-10-CM | POA: Diagnosis not present

## 2019-04-24 LAB — CBC
HCT: 34 % — ABNORMAL LOW (ref 39.0–52.0)
Hemoglobin: 10.8 g/dL — ABNORMAL LOW (ref 13.0–17.0)
MCH: 27.9 pg (ref 26.0–34.0)
MCHC: 31.8 g/dL (ref 30.0–36.0)
MCV: 87.9 fL (ref 80.0–100.0)
Platelets: 246 10*3/uL (ref 150–400)
RBC: 3.87 MIL/uL — ABNORMAL LOW (ref 4.22–5.81)
RDW: 15.3 % (ref 11.5–15.5)
WBC: 12 10*3/uL — ABNORMAL HIGH (ref 4.0–10.5)
nRBC: 0 % (ref 0.0–0.2)

## 2019-04-24 LAB — BASIC METABOLIC PANEL
Anion gap: 8 (ref 5–15)
BUN: 17 mg/dL (ref 8–23)
CO2: 26 mmol/L (ref 22–32)
Calcium: 9 mg/dL (ref 8.9–10.3)
Chloride: 103 mmol/L (ref 98–111)
Creatinine, Ser: 0.76 mg/dL (ref 0.61–1.24)
GFR calc Af Amer: >60 mL/min (ref >60)
GFR calc non Af Amer: >60 mL/min (ref >60)
Glucose, Bld: 140 mg/dL — ABNORMAL HIGH (ref 70–99)
Potassium: 4.2 mmol/L (ref 3.5–5.1)
Sodium: 137 mmol/L (ref 135–145)

## 2019-04-24 MED ORDER — SODIUM CHLORIDE 0.9 % IV BOLUS
250.0000 mL | Freq: Once | INTRAVENOUS | Status: AC
  Administered 2019-04-24: 14:00:00 250 mL via INTRAVENOUS

## 2019-04-24 NOTE — Progress Notes (Signed)
Physical Therapy Treatment Patient Details Name: Michael Contreras MRN: [id number] DOB: 09-27-1950 Today's Date: 04/24/2019    History of Present Illness Pt is a 69 y/o male s/p L TKA. PMH includes prostate cancer.     PT Comments    Pt performed gt training and functional mobility this session.  He continues to present with dizziness this session and vitals continue to show drop in systolic pressure.  Informed nursing and limited session to gt training.    BP supine: 123/69 BP sitting: 99/61 BP standing:  92/70   Follow Up Recommendations  Follow surgeon's recommendation for DC plan and follow-up therapies;Supervision for mobility/OOB     Equipment Recommendations  None recommended by PT    Recommendations for Other Services       Precautions / Restrictions Precautions Precautions: Knee Precaution Booklet Issued: No Precaution Comments: watch BP orthostatic last two sessions. Restrictions Weight Bearing Restrictions: Yes LLE Weight Bearing: Weight bearing as tolerated    Mobility  Bed Mobility            General bed mobility comments: Pt in recliner on arrivaL in reclined supine position.  Transfers Overall transfer level: Needs assistance Equipment used: Rolling walker (2 wheeled) Transfers: Sit to/from Stand Sit to Stand: Min guard         General transfer comment: Min guard for safety. Cues for safe hand placement.   Ambulation/Gait Ambulation/Gait assistance: Min guard Gait Distance (Feet): 280 Feet Assistive device: Rolling walker (2 wheeled) Gait Pattern/deviations: Step-to pattern;Decreased step length - right;Decreased step length - left;Decreased weight shift to left;Antalgic Gait velocity: Decreased   General Gait Details: Pt continues to complain of dizziness this session with increasing symptoms as gt progressed. He denies room spinning or getting dark.  He reports," I feel a little more dizzy now."   Stairs Stairs: Yes Stairs  assistance: Min guard Stair Management: One rail Right Number of Stairs: 4 General stair comments: Cues for sequencing and hand placement on railings.   Wheelchair Mobility    Modified Rankin (Stroke Patients Only)       Balance Overall balance assessment: Needs assistance Sitting-balance support: No upper extremity supported;Feet supported Sitting balance-Leahy Scale: Good       Standing balance-Leahy Scale: Poor                              Cognition Arousal/Alertness: Awake/alert Behavior During Therapy: WFL for tasks assessed/performed Overall Cognitive Status: Within Functional Limits for tasks assessed                                        Exercises     General Comments        Pertinent Vitals/Pain Pain Assessment: 0-10 Pain Score: 3  Pain Location: L knee Pain Descriptors / Indicators: Aching;Operative site guarding Pain Intervention(s): Monitored during session;Repositioned    Home Living                      Prior Function            PT Goals (current goals can now be found in the care plan section) Acute Rehab PT Goals Patient Stated Goal: to go home Potential to Achieve Goals: Good Progress towards PT goals: Progressing toward goals    Frequency    7X/week      PT  Plan Current plan remains appropriate    Co-evaluation              AM-PAC PT "6 Clicks" Mobility   Outcome Measure  Help needed turning from your back to your side while in a flat bed without using bedrails?: None Help needed moving from lying on your back to sitting on the side of a flat bed without using bedrails?: A Little Help needed moving to and from a bed to a chair (including a wheelchair)?: A Little Help needed standing up from a chair using your arms (e.g., wheelchair or bedside chair)?: A Little Help needed to walk in hospital room?: A Little Help needed climbing 3-5 steps with a railing? : A Little 6 Click Score:  19    End of Session Equipment Utilized During Treatment: Gait belt;Left knee immobilizer Activity Tolerance: Patient tolerated treatment well Patient left: in chair;with call bell/phone within reach Nurse Communication: Mobility status PT Visit Diagnosis: Other abnormalities of gait and mobility (R26.89);Pain Pain - Right/Left: Left Pain - part of body: Knee     Time: [id number] PT Time Calculation (min) (ACUTE ONLY): 23 min  Charges:  $Gait Training: 8-22 mins $Therapeutic Activity: 8-22 mins                     Erasmo Leventhal , PTA Acute Rehabilitation Services Pager [phone] Office [phone]     Michael Contreras 04/24/2019, 3:33 PM

## 2019-04-24 NOTE — Progress Notes (Addendum)
Patient c/o dizziness while working with therapy. B/P dropped from laying 104/65 to standing 88/52. Denies SOB or CP.  IV fluids were restarted @85cc /hr. Patient was encouraged to increase po fluids. On second session with therapy patient stated that he was still dizzy but it had improved compared to this morning, laying 123/69, standing 92/70. MD was notified and gave order for NS Bolus 250cc,  order initiated. Patient is currently sitting up and denies lightheadedness or dizziness.   Will cont to monitor.   1500, Rechecked patients B/P s/p Fluid Bolus infusion, patient continue to c/o dizziness,  denies SOB or CP. B/P dropped from sitting 117/68 to standing 98/61. MD notified and will keep patient overnight. Patient was notified. IV fluids infusing @85cc /hr and patient encouraged to increase po fluids. Will cont to monitor.

## 2019-04-24 NOTE — Progress Notes (Signed)
Physical Therapy Treatment Patient Details Name: Michael Contreras MRN: [id number] DOB: 03-07-1950 Today's Date: 04/24/2019    History of Present Illness Pt is a 69 y/o male s/p L TKA. PMH includes prostate cancer.     PT Comments    Pt performed gt training and functional mobility this session but limited to mobilize back to his room due to bout of dizziness after stair training.   PTA sat patient down and located BP machine for assessment.  His BP in sitting was 104/65, had patient stand again and it was 93/51, after standing x 1 min he was 88/52, PTA returned to supine and BP increased to 109/69.  He is symptomatic and returned to room in recliner in reclined position and RN informed.     Follow Up Recommendations  Follow surgeon's recommendation for DC plan and follow-up therapies;Supervision for mobility/OOB     Equipment Recommendations  None recommended by PT    Recommendations for Other Services       Precautions / Restrictions Precautions Precautions: Knee Precaution Booklet Issued: No Restrictions Weight Bearing Restrictions: Yes LLE Weight Bearing: Weight bearing as tolerated    Mobility  Bed Mobility Overal bed mobility: Needs Assistance Bed Mobility: Supine to Sit     Supine to sit: Supervision     General bed mobility comments: Supervision for safety.   Transfers Overall transfer level: Needs assistance Equipment used: Rolling walker (2 wheeled) Transfers: Sit to/from Stand Sit to Stand: Min guard         General transfer comment: Min guard for safety. Cues for safe hand placement.   Ambulation/Gait Ambulation/Gait assistance: Min guard Gait Distance (Feet): 110 Feet Assistive device: Rolling walker (2 wheeled) Gait Pattern/deviations: Step-to pattern;Decreased step length - right;Decreased step length - left;Decreased weight shift to left;Antalgic Gait velocity: Decreased   General Gait Details: Slow, antalgic gait. Cues for sequencing using  RW. Pt reporting some dizziness, which limited gait.  He required seated rest break in gym after stair training due to dizziness.   Stairs Stairs: Yes Stairs assistance: Min guard Stair Management: One rail Right Number of Stairs: 4 General stair comments: Cues for sequencing and hand placement on railings.   Wheelchair Mobility    Modified Rankin (Stroke Patients Only)       Balance Overall balance assessment: Needs assistance Sitting-balance support: No upper extremity supported;Feet supported Sitting balance-Leahy Scale: Good       Standing balance-Leahy Scale: Poor                              Cognition Arousal/Alertness: Awake/alert Behavior During Therapy: WFL for tasks assessed/performed Overall Cognitive Status: Within Functional Limits for tasks assessed                                        Exercises Total Joint Exercises Ankle Circles/Pumps: AROM;Both;20 reps Quad Sets: AROM;Left;10 reps;Supine Heel Slides: AROM;Left;10 reps;Supine Hip ABduction/ADduction: AROM;Left;10 reps;Supine Straight Leg Raises: AROM;Left;10 reps;Supine Goniometric ROM: grossly 10-70 degrees.    General Comments        Pertinent Vitals/Pain Pain Assessment: 0-10 Pain Score: 3  Pain Location: L knee Pain Descriptors / Indicators: Aching;Operative site guarding Pain Intervention(s): Monitored during session;Repositioned    Home Living  Prior Function            PT Goals (current goals can now be found in the care plan section) Acute Rehab PT Goals Patient Stated Goal: to go home Potential to Achieve Goals: Good Progress towards PT goals: Progressing toward goals    Frequency    7X/week      PT Plan Current plan remains appropriate    Co-evaluation              AM-PAC PT "6 Clicks" Mobility   Outcome Measure  Help needed turning from your back to your side while in a flat bed without using  bedrails?: None Help needed moving from lying on your back to sitting on the side of a flat bed without using bedrails?: A Little Help needed moving to and from a bed to a chair (including a wheelchair)?: A Little Help needed standing up from a chair using your arms (e.g., wheelchair or bedside chair)?: A Little Help needed to walk in hospital room?: A Little Help needed climbing 3-5 steps with a railing? : A Little 6 Click Score: 19    End of Session Equipment Utilized During Treatment: Gait belt;Left knee immobilizer Activity Tolerance: Patient tolerated treatment well Patient left: in chair;with call bell/phone within reach Nurse Communication: Mobility status PT Visit Diagnosis: Other abnormalities of gait and mobility (R26.89);Pain Pain - Right/Left: Left Pain - part of body: Knee     Time: [id number] PT Time Calculation (min) (ACUTE ONLY): 26 min  Charges:  $Gait Training: 8-22 mins $Therapeutic Exercise: 8-22 mins                     Erasmo Leventhal , PTA Acute Rehabilitation Services Pager [phone] Office [phone]     [name] 04/24/2019, 3:22 PM

## 2019-04-24 NOTE — Progress Notes (Signed)
   Subjective: 1 Day Post-Op Procedure(s) (LRB): LEFT TOTAL KNEE ARTHROPLASTY-CEMENTED (Left) Patient reports pain as mild.    Objective: Vital signs in last 24 hours: Temp:  [97.3 F (36.3 C)-98 F (36.7 C)] 97.9 F (36.6 C) (02/27 0742) Pulse Rate:  [56-73] 61 (02/27 0742) Resp:  [10-18] 18 (02/27 0742) BP: (93-117)/(57-80) 102/61 (02/27 0742) SpO2:  [93 %-100 %] 96 % (02/27 0742) Weight:  [91.8 kg] 91.8 kg (02/26 1408)  Intake/Output from previous day: 02/26 0701 - 02/27 0700 In: 1495.3 [P.O.:480; I.V.:815.3; IV Piggyback:200] Out: 450 [Urine:400; Blood:50] Intake/Output this shift: No intake/output data recorded.  Recent Labs    04/24/19 0347  HGB 10.8*   Recent Labs    04/24/19 0347  WBC 12.0*  RBC 3.87*  HCT 34.0*  PLT 246   Recent Labs    04/24/19 0347  NA 137  K 4.2  CL 103  CO2 26  BUN 17  CREATININE 0.76  GLUCOSE 140*  CALCIUM 9.0   No results for input(s): LABPT, INR in the last 72 hours.  Neurologically intact   10-90 degrees No results found.  Assessment/Plan: 1 Day Post-Op Procedure(s) (LRB): LEFT TOTAL KNEE ARTHROPLASTY-CEMENTED (Left) Up with therapy today then discharge home after therapy  Michael Contreras 04/24/2019, 10:19 AM

## 2019-04-24 NOTE — Progress Notes (Signed)
Progress Note   Pt  69 y.o.caucasian male , DOB February 16, 1951,  MRN [id number] ,   Presented alert, VS WNL, pt light headed when standing no pain reported , physical therapist working with pt.Dr notified.   Meals and liquids consumed, no signs of stress observed. , pt still being monitored with IV infusion( see MAR), no signs of  distress. medications given as Rx, no adverse reaction observed. Pt encouraged the use of incentive spirometer and PO fluid intake. LBM 04/22/2019.    [place] [phone]

## 2019-04-25 DIAGNOSIS — G8929 Other chronic pain: Secondary | ICD-10-CM | POA: Diagnosis not present

## 2019-04-25 DIAGNOSIS — Z7982 Long term (current) use of aspirin: Secondary | ICD-10-CM | POA: Diagnosis not present

## 2019-04-25 DIAGNOSIS — Z8546 Personal history of malignant neoplasm of prostate: Secondary | ICD-10-CM | POA: Diagnosis not present

## 2019-04-25 DIAGNOSIS — Z87891 Personal history of nicotine dependence: Secondary | ICD-10-CM | POA: Diagnosis not present

## 2019-04-25 DIAGNOSIS — Z6825 Body mass index (BMI) 25.0-25.9, adult: Secondary | ICD-10-CM | POA: Diagnosis not present

## 2019-04-25 DIAGNOSIS — E669 Obesity, unspecified: Secondary | ICD-10-CM | POA: Diagnosis not present

## 2019-04-25 DIAGNOSIS — J449 Chronic obstructive pulmonary disease, unspecified: Secondary | ICD-10-CM | POA: Diagnosis not present

## 2019-04-25 DIAGNOSIS — M1712 Unilateral primary osteoarthritis, left knee: Secondary | ICD-10-CM | POA: Diagnosis not present

## 2019-04-25 LAB — CBC
HCT: 31.1 % — ABNORMAL LOW (ref 39.0–52.0)
Hemoglobin: 9.7 g/dL — ABNORMAL LOW (ref 13.0–17.0)
MCH: 27.2 pg (ref 26.0–34.0)
MCHC: 31.2 g/dL (ref 30.0–36.0)
MCV: 87.1 fL (ref 80.0–100.0)
Platelets: 243 10*3/uL (ref 150–400)
RBC: 3.57 MIL/uL — ABNORMAL LOW (ref 4.22–5.81)
RDW: 15.4 % (ref 11.5–15.5)
WBC: 9.4 10*3/uL (ref 4.0–10.5)
nRBC: 0 % (ref 0.0–0.2)

## 2019-04-25 NOTE — Progress Notes (Signed)
Patient discharging home. Discharge instructions explained to patient and he verbalized understanding. Denies dizziness at this time and was advised to be mindful of quick sudden position changes. Encouraged to increase po fluids and follow up with MD if symptoms persist. Took all personal belongings. No further questions or concerns voiced.

## 2019-04-25 NOTE — Progress Notes (Signed)
Physical Therapy Treatment Patient Details Name: Michael Contreras MRN: [id number] DOB: 20-Dec-1950 Today's Date: 04/25/2019    History of Present Illness Pt is a 69 y/o male s/p L TKA. PMH includes prostate cancer.     PT Comments    Pt demonstrated improved tolerance to activity today. He reported mild dizziness upon initial standing with BP WFL (115/86). He was able to ambulate >150' and perform 10 stairs prior to reporting increased dizziness (moderate). He was then able to ambulate back to room and sit with no obvious signs of LOB/dizziness.   Pt would be safe to return home as he was able to ambulate home distances safely without any knee buckling or near LOB, with minimal symptoms of orthostatic hypotension until performing stairs this morning.  He was able to teach back exercises, precautions with dizziness, & stair training. Depends on MD recommendation currently.  BP: initial sitting 105/86 Standing: 115/85 After ambulation/stairs: 96/86 Immediately after supine: 94/56   Follow Up Recommendations  Follow surgeon's recommendation for DC plan and follow-up therapies;Supervision for mobility/OOB     Equipment Recommendations  None recommended by PT       Precautions / Restrictions Precautions Precautions: Knee Precaution Booklet Issued: No Precaution Comments: watch BP orthostatic last two sessions. Restrictions Weight Bearing Restrictions: Yes LLE Weight Bearing: Weight bearing as tolerated    Mobility  Bed Mobility Overal bed mobility: Needs Assistance Bed Mobility: Supine to Sit     Supine to sit: Supervision     General bed mobility comments: Pt required VCs for LE management due to reduced SLR ability/management today  Transfers Overall transfer level: Needs assistance Equipment used: Rolling walker (2 wheeled) Transfers: Sit to/from Stand Sit to Stand: Min guard         General transfer comment: Min guard for safety. Cues for safe hand placement.    Ambulation/Gait Ambulation/Gait assistance: Min guard Gait Distance (Feet): 285 Feet Assistive device: Rolling walker (2 wheeled) Gait Pattern/deviations: Step-to pattern;Decreased step length - right;Decreased step length - left;Decreased weight shift to left;Antalgic Gait velocity: Decreased Gait velocity interpretation: <1.31 ft/sec, indicative of household ambulator General Gait Details: Pt reports minimal dizziness upon initial standing, worsening dizziness to "moderate" following stairs (10x), moderate dizziness upon arrival back to bed.   Stairs Stairs: Yes Stairs assistance: Min guard Stair Management: One rail Right Number of Stairs: 10 General stair comments: Cues for sequencing and hand placement on railings.   Wheelchair Mobility    Modified Rankin (Stroke Patients Only)       Balance Overall balance assessment: Needs assistance Sitting-balance support: No upper extremity supported;Feet supported Sitting balance-Leahy Scale: Good     Standing balance support: Bilateral upper extremity supported;During functional activity Standing balance-Leahy Scale: Fair Standing balance comment: Reliant on BUE support           Cognition Arousal/Alertness: Awake/alert Behavior During Therapy: WFL for tasks assessed/performed Overall Cognitive Status: Within Functional Limits for tasks assessed            Exercises Total Joint Exercises Ankle Circles/Pumps: AROM;Both;20 reps Quad Sets: AROM;Left;Supine;20 reps Straight Leg Raises: AROM;Left;10 reps;Supine Goniometric ROM: grossly 10-85 degrees, visually inspected    General Comments General comments (skin integrity, edema, etc.): No signs of redness, or pain with calf palpation, minimal to moderate edema compared bilaterally.      Pertinent Vitals/Pain Pain Assessment: 0-10 Pain Score: 3  Pain Location: L knee Pain Descriptors / Indicators: Aching;Operative site guarding  PT Goals (current  goals can now be found in the care plan section) Acute Rehab PT Goals Patient Stated Goal: to go home PT Goal Formulation: With patient Time For Goal Achievement: 05/07/19 Potential to Achieve Goals: Good Progress towards PT goals: Progressing toward goals    Frequency    7X/week      PT Plan Current plan remains appropriate       AM-PAC PT "6 Clicks" Mobility   Outcome Measure  Help needed turning from your back to your side while in a flat bed without using bedrails?: None Help needed moving from lying on your back to sitting on the side of a flat bed without using bedrails?: A Little Help needed moving to and from a bed to a chair (including a wheelchair)?: A Little Help needed standing up from a chair using your arms (e.g., wheelchair or bedside chair)?: A Little Help needed to walk in hospital room?: A Little Help needed climbing 3-5 steps with a railing? : A Little 6 Click Score: 19    End of Session Equipment Utilized During Treatment: Gait belt;Left knee immobilizer Activity Tolerance: Patient tolerated treatment well Patient left: in chair;with call bell/phone within reach Nurse Communication: Mobility status PT Visit Diagnosis: Other abnormalities of gait and mobility (R26.89);Pain Pain - Right/Left: Left Pain - part of body: Knee     Time: 0829-0912 PT Time Calculation (min) (ACUTE ONLY): 43 min  Charges:  $Gait Training: 8-22 mins $Therapeutic Exercise: 8-22 mins $Therapeutic Activity: 8-22 mins                     Ann Held PT, DPT Acute Rehab [facility] P: [phone]    Dezirea Mccollister A Samreen Seltzer 04/25/2019, 9:26 AM

## 2019-04-25 NOTE — Discharge Summary (Signed)
Patient ID: Michael Contreras MRN: [id number] DOB/AGE: [birth year] 71 y.o.  Admit date: 04/23/2019 Discharge date: 04/25/2019  Admission Diagnoses:  Active Problems:   Unilateral primary osteoarthritis, left knee   Arthritis of left knee   Discharge Diagnoses:  Same  Past Medical History:  Diagnosis Date  . Arthritis   . History of hiatal hernia    inguinal   . Prostate cancer ([place])     Surgeries: Procedure(s): LEFT TOTAL KNEE ARTHROPLASTY-CEMENTED on 04/23/2019   Consultants:   Discharged Condition: Improved  Hospital Course: Michael Contreras is an 69 y.o. male who was admitted 04/23/2019 for operative treatment of<principal problem not specified>. Patient has severe unremitting pain that affects sleep, daily activities, and work/hobbies. After pre-op clearance the patient was taken to the operating room on 04/23/2019 and underwent  Procedure(s): LEFT TOTAL KNEE ARTHROPLASTY-CEMENTED.   Slow to progress initially due to orthostatic hypotension.  This has continued to improve with time.   Patient was given perioperative antibiotics:  Anti-infectives (From admission, onward)   Start     Dose/Rate Route Frequency Ordered Stop   04/23/19 0900  ceFAZolin (ANCEF) IVPB 2g/100 mL premix     2 g 200 mL/hr over 30 Minutes Intravenous On call to O.R. 04/23/19 0756 04/23/19 1036       Patient was given sequential compression devices, early ambulation, and chemoprophylaxis to prevent DVT.  Patient benefited maximally from hospital stay and there were no complications.    Recent vital signs:  Patient Vitals for the past 24 hrs:  BP Temp Temp src Pulse Resp SpO2  04/25/19 0432 127/68 98.6 F (37 C) Oral 77 -- 92 %  04/24/19 2029 128/69 98.1 F (36.7 C) Oral 81 -- 97 %  04/24/19 1723 132/66 -- -- 69 -- 98 %  04/24/19 1501 122/60 -- -- 84 -- --  04/24/19 1456 98/61 -- -- 88 -- --  04/24/19 1445 117/68 -- -- 65 -- --  04/24/19 1222 120/65 97.7 F (36.5 C) Oral 61 18 100 %      Recent laboratory studies:  Recent Labs    04/24/19 0347 04/25/19 0557  WBC 12.0* 9.4  HGB 10.8* 9.7*  HCT 34.0* 31.1*  PLT 246 243  NA 137  --   K 4.2  --   CL 103  --   CO2 26  --   BUN 17  --   CREATININE 0.76  --   GLUCOSE 140*  --   CALCIUM 9.0  --      Discharge Medications:   Allergies as of 04/25/2019   No Known Allergies     Medication List    TAKE these medications   aspirin 325 MG EC tablet Take 1 tablet (325 mg total) by mouth daily with breakfast. Must take at least 4 weeks postop for dvt prophylaxis   methocarbamol 500 MG tablet Commonly known as: ROBAXIN Take 1 tablet (500 mg total) by mouth every 6 (six) hours as needed for muscle spasms.   oxyCODONE-acetaminophen 5-325 MG tablet Commonly known as: PERCOCET/ROXICET Take 1-2 tablets by mouth every 6 (six) hours as needed for severe pain.       Diagnostic Studies: DG Chest 2 View  Result Date: 04/20/2019 CLINICAL DATA:  Pre-op for LEFT TOTAL KNEE ARTHROPLASTY-CEMENTED on 2/26. EXAM: CHEST - 2 VIEW COMPARISON:  30/20/18 FINDINGS: Cardiac silhouette is normal in size. No mediastinal or hilar masses or evidence of adenopathy. Lungs are hyperexpanded. Surgical anastomosis staples at the left apex are stable.  There is mild scarring in both upper lobes, also stable. No evidence of pneumonia or pulmonary edema. No pleural effusion or pneumothorax. Skeletal structures are intact. IMPRESSION: 1. No acute cardiopulmonary disease. Stable appearance from the prior exam. 2. COPD. Electronically Signed   By: Lajean Manes M.D.   On: 04/20/2019 16:26   XR KNEE 3 VIEW LEFT  Result Date: 04/08/2019 Standing AP both knees lateral left knee sunrise patellar x-ray demonstrates bone-on-bone changes left knee medial compartment 10degrees varus deformity.  Marginal osteophytes subchondral sclerosis. Impression left knee osteoarthritis, moderate to severe with bone-on-bone medial compartment.   Disposition: Discharge  disposition: 01-Home or [place]. Go in 1 week(s).   Specialty: Orthopedics & Sports Medicine Why: With Dr. Lorin Mercy in [place] office at 10:00 am thursday next week for check up after surgery. Contact information: [address] [place], [facility] Follow up.   Specialty: Home Health Services Why: Someone from the home health agency will be in contact with you to arrange a visit for home health physical therapy after discharge from the hospital. Contact information: [address] [place] [address] [phone]            Signed: Aundra Dubin 04/25/2019, 10:22 AM

## 2019-04-25 NOTE — Progress Notes (Signed)
Subjective: 2 Days Post-Op Procedure(s) (LRB): LEFT TOTAL KNEE ARTHROPLASTY-CEMENTED (Left) Patient reports pain as mild.  Orthostatic hypotension improving.  C/o some dizziness with PT, but vitals stable.  Ready to go home this am.   Objective: Vital signs in last 24 hours: Temp:  [97.7 F (36.5 C)-98.6 F (37 C)] 98.6 F (37 C) (02/28 0432) Pulse Rate:  [61-88] 77 (02/28 0432) Resp:  [18] 18 (02/27 1222) BP: (98-132)/(60-69) 127/68 (02/28 0432) SpO2:  [92 %-100 %] 92 % (02/28 0432)  Intake/Output from previous day: 02/27 0701 - 02/28 0700 In: 1118 [P.O.:240; I.V.:878] Out: -  Intake/Output this shift: No intake/output data recorded.  Recent Labs    04/24/19 0347 04/25/19 0557  HGB 10.8* 9.7*   Recent Labs    04/24/19 0347 04/25/19 0557  WBC 12.0* 9.4  RBC 3.87* 3.57*  HCT 34.0* 31.1*  PLT 246 243   Recent Labs    04/24/19 0347  NA 137  K 4.2  CL 103  CO2 26  BUN 17  CREATININE 0.76  GLUCOSE 140*  CALCIUM 9.0   No results for input(s): LABPT, INR in the last 72 hours.  Neurologically intact Neurovascular intact Sensation intact distally Intact pulses distally Dorsiflexion/Plantar flexion intact Incision: dressing C/D/I No cellulitis present Compartment soft   Assessment/Plan: 2 Days Post-Op Procedure(s) (LRB): LEFT TOTAL KNEE ARTHROPLASTY-CEMENTED (Left) Advance diet Up with therapy Discharge home with home health this am WBAT LLE    Anticipated LOS equal to or greater than 2 midnights due to - Age 71 and older with one or more of the following:  - Obesity  - Expected need for hospital services (PT, OT, Nursing) required for safe  discharge  - Anticipated need for postoperative skilled nursing care or inpatient rehab  - Active co-morbidities: None OR   - Unanticipated findings during/Post Surgery: Slow post-op progression: GI, pain control, mobility  - Patient is a high risk of re-admission due to: None    Michael Contreras 04/25/2019, 10:20 AM

## 2019-04-26 ENCOUNTER — Encounter: Payer: Self-pay | Admitting: *Deleted

## 2019-04-27 ENCOUNTER — Other Ambulatory Visit: Payer: Self-pay | Admitting: *Deleted

## 2019-04-27 ENCOUNTER — Telehealth: Payer: Self-pay | Admitting: *Deleted

## 2019-04-27 DIAGNOSIS — Z96652 Presence of left artificial knee joint: Secondary | ICD-10-CM

## 2019-04-27 DIAGNOSIS — Z7982 Long term (current) use of aspirin: Secondary | ICD-10-CM | POA: Diagnosis not present

## 2019-04-27 DIAGNOSIS — Z471 Aftercare following joint replacement surgery: Secondary | ICD-10-CM | POA: Diagnosis not present

## 2019-04-27 DIAGNOSIS — Z87891 Personal history of nicotine dependence: Secondary | ICD-10-CM | POA: Diagnosis not present

## 2019-04-27 DIAGNOSIS — M1712 Unilateral primary osteoarthritis, left knee: Secondary | ICD-10-CM

## 2019-04-27 DIAGNOSIS — Z8546 Personal history of malignant neoplasm of prostate: Secondary | ICD-10-CM | POA: Diagnosis not present

## 2019-04-27 NOTE — Care Plan (Signed)
Attempted yesterday to reach out to patient for Ortho bundle D/C call, but cell phone used prior to surgery, no longer working. Patient called late yesterday afternoon and left new cell number. RNCM reached him today to check status. He states he is doing very well and therapy has been out today to see him. He verbalized therapist was very pleased with progress so far. Discussed OPPT and he requested this at [facility]. CM will call and make referral and get back in touch with him regarding scheduling for around 2 weeks post-op after he sees Dr. Lorin Mercy for his first visit. Patient appreciative of call.

## 2019-04-27 NOTE — Telephone Encounter (Signed)
Ortho bundle D/C call completed. 

## 2019-04-29 ENCOUNTER — Other Ambulatory Visit: Payer: Self-pay

## 2019-04-29 ENCOUNTER — Encounter (HOSPITAL_COMMUNITY): Payer: Self-pay

## 2019-04-29 ENCOUNTER — Ambulatory Visit (HOSPITAL_COMMUNITY): Payer: Medicare HMO | Admitting: Physical Therapy

## 2019-04-29 DIAGNOSIS — Z7982 Long term (current) use of aspirin: Secondary | ICD-10-CM | POA: Diagnosis not present

## 2019-04-29 DIAGNOSIS — Z96652 Presence of left artificial knee joint: Secondary | ICD-10-CM | POA: Diagnosis not present

## 2019-04-29 DIAGNOSIS — Z471 Aftercare following joint replacement surgery: Secondary | ICD-10-CM | POA: Diagnosis not present

## 2019-04-29 DIAGNOSIS — Z87891 Personal history of nicotine dependence: Secondary | ICD-10-CM | POA: Diagnosis not present

## 2019-04-29 DIAGNOSIS — Z8546 Personal history of malignant neoplasm of prostate: Secondary | ICD-10-CM | POA: Diagnosis not present

## 2019-04-30 ENCOUNTER — Telehealth: Payer: Self-pay | Admitting: *Deleted

## 2019-04-30 DIAGNOSIS — Z471 Aftercare following joint replacement surgery: Secondary | ICD-10-CM | POA: Diagnosis not present

## 2019-04-30 DIAGNOSIS — Z8546 Personal history of malignant neoplasm of prostate: Secondary | ICD-10-CM | POA: Diagnosis not present

## 2019-04-30 DIAGNOSIS — Z87891 Personal history of nicotine dependence: Secondary | ICD-10-CM | POA: Diagnosis not present

## 2019-04-30 DIAGNOSIS — Z96652 Presence of left artificial knee joint: Secondary | ICD-10-CM | POA: Diagnosis not present

## 2019-04-30 DIAGNOSIS — Z7982 Long term (current) use of aspirin: Secondary | ICD-10-CM | POA: Diagnosis not present

## 2019-04-30 NOTE — Telephone Encounter (Signed)
7 day Ortho bundle call completed. 

## 2019-05-03 ENCOUNTER — Ambulatory Visit (HOSPITAL_COMMUNITY): Payer: Medicare HMO | Admitting: Physical Therapy

## 2019-05-03 DIAGNOSIS — Z7982 Long term (current) use of aspirin: Secondary | ICD-10-CM | POA: Diagnosis not present

## 2019-05-03 DIAGNOSIS — Z471 Aftercare following joint replacement surgery: Secondary | ICD-10-CM | POA: Diagnosis not present

## 2019-05-03 DIAGNOSIS — Z8546 Personal history of malignant neoplasm of prostate: Secondary | ICD-10-CM | POA: Diagnosis not present

## 2019-05-03 DIAGNOSIS — Z87891 Personal history of nicotine dependence: Secondary | ICD-10-CM | POA: Diagnosis not present

## 2019-05-03 DIAGNOSIS — Z96652 Presence of left artificial knee joint: Secondary | ICD-10-CM | POA: Diagnosis not present

## 2019-05-04 DIAGNOSIS — Z87891 Personal history of nicotine dependence: Secondary | ICD-10-CM | POA: Diagnosis not present

## 2019-05-04 DIAGNOSIS — Z471 Aftercare following joint replacement surgery: Secondary | ICD-10-CM | POA: Diagnosis not present

## 2019-05-04 DIAGNOSIS — Z7982 Long term (current) use of aspirin: Secondary | ICD-10-CM | POA: Diagnosis not present

## 2019-05-04 DIAGNOSIS — Z8546 Personal history of malignant neoplasm of prostate: Secondary | ICD-10-CM | POA: Diagnosis not present

## 2019-05-04 DIAGNOSIS — Z96652 Presence of left artificial knee joint: Secondary | ICD-10-CM | POA: Diagnosis not present

## 2019-05-05 ENCOUNTER — Encounter (HOSPITAL_COMMUNITY): Payer: Medicare HMO | Admitting: Physical Therapy

## 2019-05-06 ENCOUNTER — Ambulatory Visit (INDEPENDENT_AMBULATORY_CARE_PROVIDER_SITE_OTHER): Payer: Medicare HMO | Admitting: Orthopaedic Surgery

## 2019-05-06 ENCOUNTER — Ambulatory Visit (HOSPITAL_COMMUNITY): Payer: Medicare HMO | Admitting: Physical Therapy

## 2019-05-06 ENCOUNTER — Encounter: Payer: Self-pay | Admitting: Orthopaedic Surgery

## 2019-05-06 ENCOUNTER — Ambulatory Visit (INDEPENDENT_AMBULATORY_CARE_PROVIDER_SITE_OTHER): Payer: Medicare HMO

## 2019-05-06 VITALS — Ht 75.0 in | Wt 202.0 lb

## 2019-05-06 DIAGNOSIS — Z96652 Presence of left artificial knee joint: Secondary | ICD-10-CM

## 2019-05-06 MED ORDER — OXYCODONE-ACETAMINOPHEN 5-325 MG PO TABS: 1.0000 | ORAL_TABLET | Freq: Four times a day (QID) | ORAL | 0 refills | Status: AC | PRN

## 2019-05-06 NOTE — Progress Notes (Signed)
Postop left total knee arthroplasty.  Pain medication refilled the starting outpatient therapy in [place].  He has full extension is flexing past 100 degrees.  X-rays show good position alignment.  Recheck 4 weeks he is happy with the surgical result.

## 2019-05-07 ENCOUNTER — Other Ambulatory Visit: Payer: Self-pay

## 2019-05-07 ENCOUNTER — Ambulatory Visit (HOSPITAL_COMMUNITY): Payer: Medicare HMO | Attending: Orthopaedic Surgery | Admitting: Physical Therapy

## 2019-05-07 DIAGNOSIS — R262 Difficulty in walking, not elsewhere classified: Secondary | ICD-10-CM | POA: Insufficient documentation

## 2019-05-07 DIAGNOSIS — M6281 Muscle weakness (generalized): Secondary | ICD-10-CM | POA: Diagnosis not present

## 2019-05-07 DIAGNOSIS — M25662 Stiffness of left knee, not elsewhere classified: Secondary | ICD-10-CM | POA: Diagnosis not present

## 2019-05-07 NOTE — Patient Instructions (Signed)
Access Code: [email or web address]  URL: [email or web address]  Date: 05/07/2019  Prepared by: Yetta Glassman   Exercises Seated Quad Set - 5 reps - 2 sets - 10 hold - 5x daily - 7x weekly Seated Calf Stretch with Strap - 3 reps - 1 sets - 30 hold - 2x daily - 7x weekly Seated Long Arc Quad - 10 reps - 3 sets - 5 hold - 2x daily - 7x weekly Seated Knee Flexion AAROM - 5 reps - 4 sets - 10 hold - 5x daily - 7x weekly Sit to Stand - 5 reps - 4 sets - 3x daily - 7x weekly

## 2019-05-07 NOTE — Therapy (Signed)
[facility] [address] [place], [place], [address] Phone: [phone]   Fax:  [phone]  Physical Therapy Evaluation  Patient Details  Name: Michael Contreras MRN: [id number] Date of Birth: December 07, 1950 Referring Provider (PT): Rodell Perna   Encounter Date: 05/07/2019  PT End of Session - 05/07/19 1106    Visit Number  1    Number of Visits  18    Date for PT Re-Evaluation  07/02/19    Authorization Time Period  POC dates 05/07/19 to 07/02/2019    Progress Note Due on Visit  10    PT Start Time  1111    PT Stop Time  1148    PT Time Calculation (min)  37 min       Past Medical History:  Diagnosis Date  . Arthritis   . History of hiatal hernia    inguinal   . Prostate cancer [facility])     Past Surgical History:  Procedure Laterality Date  . APPENDECTOMY    . HERNIA REPAIR    . LYMPHADENECTOMY Bilateral 05/16/2016   Procedure: PELVIC LYMPHADENECTOMY;  Surgeon: Raynelle Bring, MD;  Location: [facility];  Service: Urology;  Laterality: Bilateral;  . ROBOT ASSISTED LAPAROSCOPIC RADICAL PROSTATECTOMY N/A 05/16/2016   Procedure: XI ROBOTIC ASSISTED LAPAROSCOPIC RADICAL PROSTATECTOMY LEVEL 2;  Surgeon: Raynelle Bring, MD;  Location: [facility];  Service: Urology;  Laterality: N/A;  . TOTAL KNEE ARTHROPLASTY Left 04/23/2019   Procedure: LEFT TOTAL KNEE ARTHROPLASTY-CEMENTED;  Surgeon: Marybelle Killings, MD;  Location: [facility];  Service: Orthopedics;  Laterality: Left;    There were no vitals filed for this visit.   Subjective Assessment - 05/07/19 1158    Subjective  Patient reports he was having left knee pain for over 2 years. States that he got the injections and they didn't help. Reports that he decided to have his knee replaced and underwent surgery on 04/23/19. He saw the MD yesterday and states that he is doing good. Sutures were removed yesterday. States he has been trying to move his knee as much as possible and gets on the end of his truck and  swings his leg back and forth. States he quit using the walker 2 days ago and has been using the cane as needed but forgot it today.    Pertinent History  prostate cancer    Limitations  Walking;Standing;Lifting;Sitting;House hold activities    Currently in Pain?  Yes    Pain Score  3     Pain Location  Knee    Pain Orientation  Left    Pain Descriptors / Indicators  Aching;Burning    Pain Type  Surgical pain    Pain Onset  1 to 4 weeks ago    Pain Frequency  Intermittent    Aggravating Factors   bending knee, walking, standing    Pain Relieving Factors  elevation, movement         [facility] PT Assessment - 05/07/19 0001      Assessment   Medical Diagnosis  s/p left TKA    Referring Provider (PT)  Rodell Perna    Onset Date/Surgical Date  04/23/19      Precautions   Precautions  Fall      Restrictions   Weight Bearing Restrictions  Yes    LLE Weight Bearing  Weight bearing as tolerated      Balance Screen   Has the patient fallen in the past 6 months  No  Has the patient had a decrease in activity level because of a fear of falling?   No    Is the patient reluctant to leave their home because of a fear of falling?   No      Home Environment   Living Environment  Private residence    Available Help at Discharge  Friend(s)    Type of [place] to enter    Entrance Stairs-Number of Steps  5    Entrance Stairs-Rails  Can reach both    [place]  One level    [place] - 2 wheels;Cane - quad      Prior Function   Level of Independence  Independent    Leisure  cut hay for animals      Cognition   Overall Cognitive Status  Within Functional Limits for tasks assessed      Observation/Other Assessments   Observations  incision healing well - steri strips in place - sutures removed - no signs of redness.     Focus on Therapeutic Outcomes (FOTO)   54% limited       ROM / Strength   AROM / PROM / Strength  AROM;Strength       AROM   AROM Assessment Site  Knee    Right/Left Knee  Left;Right    Right Knee Extension  130    Right Knee Flexion  0    Left Knee Extension  5   lacking   Left Knee Flexion  104      Strength   Overall Strength Comments  able to perform 5 SLR with < 5 degrees of extention lag on left       Palpation   Patella mobility  good mobility in all directions on L    Palpation comment  no tenderness noted on this date      Special Tests   Other special tests  negative homan's sign bilaterally       Transfers   Transfers  Sit to Stand;Stand to Sit    Sit to Stand  With upper extremity assist;6: Modified independent (Device/Increase time)   favors right LE   Stand to Sit  Without upper extremity assist;Uncontrolled descent;6: Modified independent (Device/Increase time)   favors right LE     Ambulation/Gait   Ambulation/Gait  Yes    Ambulation/Gait Assistance  6: Modified independent (Device/Increase time)    Ambulation Distance (Feet)  306 Feet    Assistive device  Straight cane    Gait Pattern  Step-through pattern;Decreased stride length;Decreased dorsiflexion - left;Left foot flat    Ambulation Surface  Level;Indoor    Gait velocity  decreased    Gait Comments  2MW                Objective measurements completed on examination: See above findings.      [place] Adult PT Treatment/Exercise - 05/07/19 0001      Exercises   Exercises  Knee/Hip      Knee/Hip Exercises: Stretches   Gastroc Stretch  Left;3 reps;30 seconds   with towel seated     Knee/Hip Exercises: Seated   Long Arc Quad  AROM;Strengthening;Left;2 sets;10 reps   2" holds   Other Seated Knee/Hip Exercises  knee flexion AAROM x5 10" holds L    Other Seated Knee/Hip Exercises  knee extension stretch - x5 10" holds with active quad iso and overpressure with hands  Sit to Sand  5 reps;with UE support   UE on thighs, blue cushion, 4 sets             PT Education - 05/07/19 1207    Education  Details  on HEP, current condition and rehab    Person(s) Educated  Patient    Methods  Explanation;Handout    Comprehension  Verbalized understanding       PT Short Term Goals - 05/07/19 1107      PT SHORT TERM GOAL #1   Title  Patient will be independent in HEP to improve functional outcomes    Time  4    Period  Weeks    Status  New    Target Date  06/04/19      PT SHORT TERM GOAL #2   Title  Patient will demonstrate 0-120 degrees of left knee ROM to improve functional knee mobility.    Time  4    Period  Weeks    Status  New    Target Date  06/04/19      PT SHORT TERM GOAL #3   Title  Patient will be able to ambulate at least 2 minutes without an assistive device to demonstrate improved ambulatory mobility.    Time  4    Period  Weeks    Status  New    Target Date  06/04/19        PT Long Term Goals - 05/07/19 1108      PT LONG TERM GOAL #1   Title  Patient will report at least 50% improvement in overall functional mobility.    Time  8    Period  Weeks    Status  New    Target Date  07/02/19      PT LONG TERM GOAL #2   Title  Patient will be able to ascend and descend steps with reciprocal gait pattern and with use of railing as needed to improve ability to enter/leave his home.    Time  8    Period  Weeks    Status  New    Target Date  07/02/19      PT LONG TERM GOAL #3   Time  --    Period  --    Status  --    Target Date  --             Plan - 05/07/19 1107    Clinical Impression Statement  Patient is s/p left TKA on 04/23/19. He is doing excellent given he is 2 weeks post-operative. Educated patient in current presentation, rehabilitation healing and importance of HEP adherence. Patient verbalized understanding. Excellent quad isometric noted and ROM is currently 5-104. Patient would benefit from skilled physical therapy at this time to improve functional mobility and strength to help with rehabilitation recovery and return patient to optimal  function.    Examination-Activity Limitations  Bend;Squat;Sit;Stand;Stairs;Transfers;Lift;Locomotion Level    Examination-Participation Restrictions  Cleaning;Driving;Yard Work;Community Activity    Stability/Clinical Decision Making  Stable/Uncomplicated    Clinical Decision Making  Low    Rehab Potential  Excellent    PT Frequency  Other (comment)   3x/week for 2 weeks then 2x/week for 6 weeks   PT Duration  8 weeks    PT Treatment/Interventions  ADLs/Self Care Home Management;Aquatic Therapy;Cryotherapy;Electrical Stimulation;Traction;Balance training;Moist Heat;Therapeutic exercise;Therapeutic activities;Functional mobility training;Stair training;Gait training;Neuromuscular re-education;Patient/family education;Manual techniques;Dry needling;Passive range of motion;Scar mobilization;Joint Manipulations    PT Next Visit Plan  gait  training - heel strick, STS, knee ROM and strengthening, bike    PT Home Exercise Plan  3/12: calf stretch, AAROM knee flexion, knee extension stretch, LAQs, sit to stand    Consulted and Agree with Plan of Care  Patient       Patient will benefit from skilled therapeutic intervention in order to improve the following deficits and impairments:  Abnormal gait, Pain, Difficulty walking, Decreased mobility, Decreased strength, Increased edema, Decreased skin integrity, Decreased endurance, Decreased range of motion, Decreased balance, Decreased activity tolerance  Visit Diagnosis: Decreased range of motion (ROM) of left knee  Difficulty in walking, not elsewhere classified  Muscle weakness (generalized)     Problem List Patient Active Problem List   Diagnosis Date Noted  . 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 Prostate cancer ([place]) 05/16/2016  . Abscess    12:09 PM, 05/07/19 Jerene Pitch, DPT Physical Therapy with [facility]  [phone] office  [place], [place], [address] Phone: [phone]   Fax:  [phone]  Name: Michael Contreras MRN: [id number] Date of Birth: 1950-04-09

## 2019-05-10 ENCOUNTER — Encounter (HOSPITAL_COMMUNITY): Payer: Self-pay | Admitting: Physical Therapy

## 2019-05-10 ENCOUNTER — Ambulatory Visit (HOSPITAL_COMMUNITY): Payer: Medicare HMO | Admitting: Physical Therapy

## 2019-05-10 ENCOUNTER — Other Ambulatory Visit: Payer: Self-pay

## 2019-05-10 ENCOUNTER — Telehealth: Payer: Self-pay | Admitting: *Deleted

## 2019-05-10 DIAGNOSIS — M25662 Stiffness of left knee, not elsewhere classified: Secondary | ICD-10-CM | POA: Diagnosis not present

## 2019-05-10 DIAGNOSIS — R262 Difficulty in walking, not elsewhere classified: Secondary | ICD-10-CM

## 2019-05-10 DIAGNOSIS — M6281 Muscle weakness (generalized): Secondary | ICD-10-CM

## 2019-05-10 NOTE — Telephone Encounter (Signed)
Attempted Ortho bundle call on Friday, 05/07/19 and Monday, 05/10/19. No answer and no way to leave VM on cell phone. Will attempt again.

## 2019-05-10 NOTE — Therapy (Signed)
[place], [place], [address] Phone: [phone]   Fax:  [phone]  Physical Therapy Treatment  Patient Details  Name: Michael Contreras MRN: [id number] Date of Birth: 1950/06/30 Referring Provider (PT): Rodell Perna   Encounter Date: 05/10/2019  PT End of Session - 05/10/19 1303    Visit Number  2    Number of Visits  18    Date for PT Re-Evaluation  07/02/19    Authorization Time Period  POC dates 05/07/19 to 07/02/2019    Progress Note Due on Visit  10    PT Start Time  1305    PT Stop Time  1345    PT Time Calculation (min)  40 min       Past Medical History:  Diagnosis Date  . Arthritis   . History of hiatal hernia    inguinal   . Prostate cancer [facility])     Past Surgical History:  Procedure Laterality Date  . APPENDECTOMY    . HERNIA REPAIR    . LYMPHADENECTOMY Bilateral 05/16/2016   Procedure: PELVIC LYMPHADENECTOMY;  Surgeon: Raynelle Bring, MD;  [facility]: [facility];  Service: Urology;  Laterality: Bilateral;  . ROBOT ASSISTED LAPAROSCOPIC RADICAL PROSTATECTOMY N/A 05/16/2016   Procedure: XI ROBOTIC ASSISTED LAPAROSCOPIC RADICAL PROSTATECTOMY LEVEL 2;  Surgeon: Raynelle Bring, MD;  [facility];  Service: Urology;  Laterality: N/A;  . TOTAL KNEE ARTHROPLASTY Left 04/23/2019   Procedure: LEFT TOTAL KNEE ARTHROPLASTY-CEMENTED;  Surgeon: Marybelle Killings, MD;  Location: [place];  Service: Orthopedics;  Laterality: Left;    There were no vitals filed for this visit.  Subjective Assessment - 05/10/19 1309    Subjective  States he helped plant 5 rows of potatoes after last session and his knee got really stiff. STates he also almost fell about twice since the ground was uneven.    Pertinent History  prostate cancer    Limitations  Walking;Standing;Lifting;Sitting;House hold activities    Currently in Pain?  Yes    Pain Score  3     Pain Location  Knee    Pain Orientation  Left    Pain Descriptors /  Indicators  Aching    Pain Onset  1 to 4 weeks ago         [facility] PT Assessment - 05/10/19 0001      Assessment   Medical Diagnosis  s/p left TKA    Referring Provider (PT)  Rodell Perna    Onset Date/Surgical Date  04/23/19                   [facility] Adult PT Treatment/Exercise - 05/10/19 0001      Ambulation/Gait   Ambulation/Gait  Yes    Ambulation/Gait Assistance  6: Modified independent (Device/Increase time)    Ambulation Distance (Feet)  50 Feet    Ambulation Surface  Level;Indoor    Gait Comments  gait mechanics - heel toe walking       Knee/Hip Exercises: Stretches   Press photographer  Left;3 reps;30 seconds    Gastroc Stretch Limitations  incline    Other Knee/Hip Stretches  knee flexion and extension stretch on12" step x5 L each    Other Knee/Hip Stretches         Knee/Hip Exercises: Aerobic   Stationary Bike  5 minutes      Knee/Hip Exercises: Standing   Terminal Knee Extension  AROM;Strengthening;Left;3 sets;10 reps   rolled towel, 5" holds  Knee/Hip Exercises: Seated   Other Seated Knee/Hip Exercises  chair scoots - 25 feet with PT assist              PT Education - 05/10/19 1312    Education Details  on not over doing things, on how his knee prosthesis still needs to heal and how falls and certain movements too soon can loosen the hardware. ON HEP, signs/symptoms of infection and importance of continued elevation    Person(s) Educated  Patient    Methods  Explanation    Comprehension  Verbalized understanding       PT Short Term Goals - 05/07/19 1107      PT SHORT TERM GOAL #1   Title  Patient will be independent in HEP to improve functional outcomes    Time  4    Period  Weeks    Status  New    Target Date  06/04/19      PT SHORT TERM GOAL #2   Title  Patient will demonstrate 0-120 degrees of left knee ROM to improve functional knee mobility.    Time  4    Period  Weeks    Status  New    Target Date  06/04/19      PT SHORT  TERM GOAL #3   Title  Patient will be able to ambulate at least 2 minutes without an assistive device to demonstrate improved ambulatory mobility.    Time  4    Period  Weeks    Status  New    Target Date  06/04/19        PT Long Term Goals - 05/07/19 1108      PT LONG TERM GOAL #1   Title  Patient will report at least 50% improvement in overall functional mobility.    Time  8    Period  Weeks    Status  New    Target Date  07/02/19      PT LONG TERM GOAL #2   Title  Patient will be able to ascend and descend steps with reciprocal gait pattern and with use of railing as needed to improve ability to enter/leave his home.    Time  8    Period  Weeks    Status  New    Target Date  07/02/19      PT LONG TERM GOAL #3   Time  --    Period  --    Status  --    Target Date  --            Plan - 05/10/19 1323    Clinical Impression Statement  Focused on devolving HEP today and adding both knee flexion and extension exercises. Patient able to complete full revolutions on bicycle with minimal difficulties. Patient is doing very well after his recent TKA. Will continue to focus on knee exercises to improve range of motion and functional strength.    Examination-Activity Limitations  Bend;Squat;Sit;Stand;Stairs;Transfers;Lift;Locomotion Level    Examination-Participation Restrictions  Cleaning;Driving;Yard Work;Community Activity    Stability/Clinical Decision Making  Stable/Uncomplicated    Rehab Potential  Excellent    PT Frequency  Other (comment)   3x/week for 2 weeks then 2x/week for 6 weeks   PT Duration  8 weeks    PT Treatment/Interventions  ADLs/Self Care Home Management;Aquatic Therapy;Cryotherapy;Electrical Stimulation;Traction;Balance training;Moist Heat;Therapeutic exercise;Therapeutic activities;Functional mobility training;Stair training;Gait training;Neuromuscular re-education;Patient/family education;Manual techniques;Dry needling;Passive range of motion;Scar  mobilization;Joint Manipulations    PT Next Visit  Plan  gait training - heel strick, STS, knee ROM and strengthening, bike    PT Home Exercise Plan  3/12: calf stretch, AAROM knee flexion, knee extension stretch, LAQs, sit to stand; 3/15 - calf stretch on step, heel raise holds, TKE    Consulted and Agree with Plan of Care  Patient       Patient will benefit from skilled therapeutic intervention in order to improve the following deficits and impairments:  Abnormal gait, Pain, Difficulty walking, Decreased mobility, Decreased strength, Increased edema, Decreased skin integrity, Decreased endurance, Decreased range of motion, Decreased balance, Decreased activity tolerance  Visit Diagnosis: Decreased range of motion (ROM) of left knee  Difficulty in walking, not elsewhere classified  Muscle weakness (generalized)     Problem List Patient Active Problem List   Diagnosis Date Noted  . 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 Prostate cancer ([place]) 05/16/2016  . Abscess     1:42 PM, 05/10/19 Jerene Pitch, DPT Physical Therapy with [facility]  [phone] office  [place] [address] [place], [place], [address] Phone: [phone]   Fax:  [phone]  Name: Michael Contreras MRN: [id number] Date of Birth: [birth year]

## 2019-05-12 ENCOUNTER — Ambulatory Visit (HOSPITAL_COMMUNITY): Payer: Medicare HMO | Admitting: Physical Therapy

## 2019-05-12 ENCOUNTER — Encounter (HOSPITAL_COMMUNITY): Payer: Self-pay | Admitting: Physical Therapy

## 2019-05-12 ENCOUNTER — Other Ambulatory Visit: Payer: Self-pay

## 2019-05-12 DIAGNOSIS — M25662 Stiffness of left knee, not elsewhere classified: Secondary | ICD-10-CM

## 2019-05-12 DIAGNOSIS — R262 Difficulty in walking, not elsewhere classified: Secondary | ICD-10-CM | POA: Diagnosis not present

## 2019-05-12 DIAGNOSIS — M6281 Muscle weakness (generalized): Secondary | ICD-10-CM

## 2019-05-12 NOTE — Therapy (Signed)
[place], [place], [address] Phone: [phone]   Fax:  [phone]  Physical Therapy Treatment  Patient Details  Name: Michael Contreras MRN: [id number] Date of Birth: 02-09-1951 Referring Provider (PT): Rodell Perna   Encounter Date: 05/12/2019  PT End of Session - 05/12/19 1316    Visit Number  3    Number of Visits  18    Date for PT Re-Evaluation  07/02/19    Authorization Time Period  POC dates 05/07/19 to 07/02/2019    Progress Note Due on Visit  10    PT Start Time  1307    PT Stop Time  [id number]    PT Time Calculation (min)  40 min       Past Medical History:  Diagnosis Date  . Arthritis   . History of hiatal hernia    inguinal   . Prostate cancer [facility])     Past Surgical History:  Procedure Laterality Date  . APPENDECTOMY    . HERNIA REPAIR    . LYMPHADENECTOMY Bilateral 05/16/2016   Procedure: PELVIC LYMPHADENECTOMY;  Surgeon: Raynelle Bring, MD;  [facility];  Service: Urology;  Laterality: Bilateral;  . ROBOT ASSISTED LAPAROSCOPIC RADICAL PROSTATECTOMY N/A 05/16/2016   Procedure: XI ROBOTIC ASSISTED LAPAROSCOPIC RADICAL PROSTATECTOMY LEVEL 2;  Surgeon: Raynelle Bring, MD;  [facility];  Service: Urology;  Laterality: N/A;  . TOTAL KNEE ARTHROPLASTY Left 04/23/2019   Procedure: LEFT TOTAL KNEE ARTHROPLASTY-CEMENTED;  Surgeon: Marybelle Killings, MD;  Location: [place];  Service: Orthopedics;  Laterality: Left;    There were no vitals filed for this visit.  Subjective Assessment - 05/12/19 1314    Subjective  States that he is feeling good. He has been taking things easy in the garden.    Pertinent History  prostate cancer    Limitations  Walking;Standing;Lifting;Sitting;House hold activities    Currently in Pain?  No/denies    Pain Onset  1 to 4 weeks ago         [facility] PT Assessment - 05/12/19 0001      Assessment   Medical Diagnosis  s/p left TKA    Referring Provider (PT)  Rodell Perna    Onset Date/Surgical Date  04/23/19                   [facility] Adult PT Treatment/Exercise - 05/12/19 0001      Knee/Hip Exercises: Stretches   Gastroc Stretch  Left   w belt around back - 6 minutes - quad iso &PT overpressure     Knee/Hip Exercises: Aerobic   Stationary Bike  10 minutes - PT progressively moved sit closer getting up to position 11       Knee/Hip Exercises: Standing   Terminal Knee Extension  AROM;Strengthening;Left;3 sets;10 reps   purple band, 5" holds, reg and retro   Other Standing Knee Exercises  TKE walking with purple band retro and forward 2x5 Each      Knee/Hip Exercises: Supine   Knee Extension  AROM;Left    Knee Extension Limitations  14    Knee Flexion  Left;AROM    Knee Flexion Limitations  114               PT Short Term Goals - 05/07/19 1107      PT SHORT TERM GOAL #1   Title  Patient will be independent in HEP to improve functional outcomes    Time  4  Period  Weeks    Status  New    Target Date  06/04/19      PT SHORT TERM GOAL #2   Title  Patient will demonstrate 0-120 degrees of left knee ROM to improve functional knee mobility.    Time  4    Period  Weeks    Status  New    Target Date  06/04/19      PT SHORT TERM GOAL #3   Title  Patient will be able to ambulate at least 2 minutes without an assistive device to demonstrate improved ambulatory mobility.    Time  4    Period  Weeks    Status  New    Target Date  06/04/19        PT Long Term Goals - 05/07/19 1108      PT LONG TERM GOAL #1   Title  Patient will report at least 50% improvement in overall functional mobility.    Time  8    Period  Weeks    Status  New    Target Date  07/02/19      PT LONG TERM GOAL #2   Title  Patient will be able to ascend and descend steps with reciprocal gait pattern and with use of railing as needed to improve ability to enter/leave his home.    Time  8    Period  Weeks    Status  New    Target Date  07/02/19       PT LONG TERM GOAL #3   Time  --    Period  --    Status  --    Target Date  --            Plan - 05/12/19 1355    Clinical Impression Statement  Focused on TKE today. Patient tolerated this moderately well. Flexion measured at 114 today with extension being primary limitation, improved TKE noted with stretch with belt. Will continue to work on [employer] as well as calf/quad length and strength. Patient will continue to benefit from skilled physical therapy to improve functional outcomes.    Examination-Activity Limitations  Bend;Squat;Sit;Stand;Stairs;Transfers;Lift;Locomotion Level    Examination-Participation Restrictions  Cleaning;Driving;Yard Work;Community Activity    Stability/Clinical Decision Making  Stable/Uncomplicated    Rehab Potential  Excellent    PT Frequency  Other (comment)   3x/week for 2 weeks then 2x/week for 6 weeks   PT Duration  8 weeks    PT Treatment/Interventions  ADLs/Self Care Home Management;Aquatic Therapy;Cryotherapy;Electrical Stimulation;Traction;Balance training;Moist Heat;Therapeutic exercise;Therapeutic activities;Functional mobility training;Stair training;Gait training;Neuromuscular re-education;Patient/family education;Manual techniques;Dry needling;Passive range of motion;Scar mobilization;Joint Manipulations    PT Next Visit Plan  focus on TKE -calf and quad strength/length at end range. gait training - heel strick, STS, knee ROM and strengthening, bike    PT Home Exercise Plan  3/12: calf stretch, AAROM knee flexion, knee extension stretch, LAQs, sit to stand; 3/15 - calf stretch on step, heel raise holds, TKE    Consulted and Agree with Plan of Care  Patient       Patient will benefit from skilled therapeutic intervention in order to improve the following deficits and impairments:  Abnormal gait, Pain, Difficulty walking, Decreased mobility, Decreased strength, Increased edema, Decreased skin integrity, Decreased endurance, Decreased range of motion,  Decreased balance, Decreased activity tolerance  Visit Diagnosis: Decreased range of motion (ROM) of left knee  Difficulty in walking, not elsewhere classified  Muscle weakness (generalized)  Problem List Patient Active Problem List   Diagnosis Date Noted  . 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 Prostate cancer ([place]) 05/16/2016  . Abscess    1:55 PM, 05/12/19 Jerene Pitch, DPT Physical Therapy with [facility]  [phone] office  [place] [address] [place], [place], [address] Phone: [phone]   Fax:  [phone]  Name: TROI GODLEWSKI MRN: [id number] Date of Birth: 1951/02/04

## 2019-05-13 ENCOUNTER — Telehealth: Payer: Self-pay | Admitting: *Deleted

## 2019-05-13 NOTE — Telephone Encounter (Signed)
14 day Ortho bundle call complete.

## 2019-05-13 NOTE — Care Plan (Signed)
Call to patient to check status. CM attempted to make 2 week call several times, but unable to reach patient. Patient is now 20 days post-op and states he is doing well. He is attending OPPT and per the notes he has good flexion at this time. Reminded to call CM with any questions, concerns or needs.

## 2019-05-14 ENCOUNTER — Ambulatory Visit (HOSPITAL_COMMUNITY): Payer: Medicare HMO | Admitting: Physical Therapy

## 2019-05-14 ENCOUNTER — Other Ambulatory Visit: Payer: Self-pay

## 2019-05-14 ENCOUNTER — Encounter (HOSPITAL_COMMUNITY): Payer: Self-pay | Admitting: Physical Therapy

## 2019-05-14 DIAGNOSIS — M25662 Stiffness of left knee, not elsewhere classified: Secondary | ICD-10-CM

## 2019-05-14 DIAGNOSIS — R262 Difficulty in walking, not elsewhere classified: Secondary | ICD-10-CM | POA: Diagnosis not present

## 2019-05-14 DIAGNOSIS — M6281 Muscle weakness (generalized): Secondary | ICD-10-CM | POA: Diagnosis not present

## 2019-05-14 NOTE — Therapy (Signed)
[place] [address] [place], [place], [address] Phone: [phone]   Fax:  [phone]  Physical Therapy Treatment  Patient Details  Name: Michael Contreras MRN: [id number] Date of Birth: April 29, 1950 Referring Provider (PT): Rodell Perna   Encounter Date: 05/14/2019  PT End of Session - 05/14/19 1116    Visit Number  4    Number of Visits  18    Date for PT Re-Evaluation  07/02/19    Authorization Time Period  POC dates 05/07/19 to 07/02/2019    Progress Note Due on Visit  10    PT Start Time  1116    PT Stop Time  1200    PT Time Calculation (min)  44 min       Past Medical History:  Diagnosis Date  . Arthritis   . History of hiatal hernia    inguinal   . Prostate cancer [facility])     Past Surgical History:  Procedure Laterality Date  . APPENDECTOMY    . HERNIA REPAIR    . LYMPHADENECTOMY Bilateral 05/16/2016   Procedure: PELVIC LYMPHADENECTOMY;  Surgeon: Raynelle Bring, MD;  [facility];  Service: Urology;  Laterality: Bilateral;  . ROBOT ASSISTED LAPAROSCOPIC RADICAL PROSTATECTOMY N/A 05/16/2016   Procedure: XI ROBOTIC ASSISTED LAPAROSCOPIC RADICAL PROSTATECTOMY LEVEL 2;  Surgeon: Raynelle Bring, MD;  [facility];  Service: Urology;  Laterality: N/A;  . TOTAL KNEE ARTHROPLASTY Left 04/23/2019   Procedure: LEFT TOTAL KNEE ARTHROPLASTY-CEMENTED;  Surgeon: Marybelle Killings, MD;  Location: [place];  Service: Orthopedics;  Laterality: Left;    There were no vitals filed for this visit.  Subjective Assessment - 05/14/19 1118    Subjective  States he has been having some stiffness but no real pain.  States straightening is getting better.    Pertinent History  prostate cancer    Limitations  Walking;Standing;Lifting;Sitting;House hold activities    Pain Onset  1 to 4 weeks ago         [facility] PT Assessment - 05/14/19 0001      Assessment   Medical Diagnosis  s/p left TKA    Referring Provider (PT)  Rodell Perna    Onset  Date/Surgical Date  04/23/19                   [facility] Adult PT Treatment/Exercise - 05/14/19 0001      Knee/Hip Exercises: Aerobic   Stationary Bike  6 minutes on bike with progressively getting seat moved up   focused on bringing foot in DF with movement     Knee/Hip Exercises: Standing   Other Standing Knee Exercises  heel and toe walk x2 Each - 30 feet      Knee/Hip Exercises: Supine   Short Arc Quad Sets  AROM;Strengthening;Left;3 sets;10 reps   2" holds   Bridges  Both;5 sets;5 reps   5" holds, TKE stretch between sets - 10" holds   Knee Extension  AROM;Left    Knee Extension Limitations  11   lacking   Knee Flexion  AROM;Left    Knee Flexion Limitations  121      Knee/Hip Exercises: Prone   Hip Extension  AROM;Strengthening;Both;4 sets;5 reps   5" holds, cues to keep pelvis flat on table   Other Prone Exercises  prone knee hangs x4 L 60" holds; intermittent knee flexion L x10 between prone extension exercises    Other Prone Exercises  TKE 3x10 5" L; knee flexion with  contralateral overpressure 2x5, 10" holds L               PT Short Term Goals - 05/07/19 1107      PT SHORT TERM GOAL #1   Title  Patient will be independent in HEP to improve functional outcomes    Time  4    Period  Weeks    Status  New    Target Date  06/04/19      PT SHORT TERM GOAL #2   Title  Patient will demonstrate 0-120 degrees of left knee ROM to improve functional knee mobility.    Time  4    Period  Weeks    Status  New    Target Date  06/04/19      PT SHORT TERM GOAL #3   Title  Patient will be able to ambulate at least 2 minutes without an assistive device to demonstrate improved ambulatory mobility.    Time  4    Period  Weeks    Status  New    Target Date  06/04/19        PT Long Term Goals - 05/07/19 1108      PT LONG TERM GOAL #1   Title  Patient will report at least 50% improvement in overall functional mobility.    Time  8    Period  Weeks     Status  New    Target Date  07/02/19      PT LONG TERM GOAL #2   Title  Patient will be able to ascend and descend steps with reciprocal gait pattern and with use of railing as needed to improve ability to enter/leave his home.    Time  8    Period  Weeks    Status  New    Target Date  07/02/19      PT LONG TERM GOAL #3   Time  --    Period  --    Status  --    Target Date  --            Plan - 05/14/19 1135    Clinical Impression Statement  Focused today on prone exercises. This was tolerated well. Improvement in TKE noted with prone TKE. Primary limitation continues to be TKE, but this improves with each session measuring at 11 (lacking) on this date. Will continue to work on strengthening at end range as well as calf strength to improve walking mechanics and knee ROM. Patient will continue to benefit from skilled physical therapy at this time.    Examination-Activity Limitations  Bend;Squat;Sit;Stand;Stairs;Transfers;Lift;Locomotion Level    Examination-Participation Restrictions  Cleaning;Driving;Yard Work;Community Activity    Stability/Clinical Decision Making  Stable/Uncomplicated    Rehab Potential  Excellent    PT Frequency  Other (comment)   3x/week for 2 weeks then 2x/week for 6 weeks   PT Duration  8 weeks    PT Treatment/Interventions  ADLs/Self Care Home Management;Aquatic Therapy;Cryotherapy;Electrical Stimulation;Traction;Balance training;Moist Heat;Therapeutic exercise;Therapeutic activities;Functional mobility training;Stair training;Gait training;Neuromuscular re-education;Patient/family education;Manual techniques;Dry needling;Passive range of motion;Scar mobilization;Joint Manipulations    PT Next Visit Plan  focus on TKE -calf and quad strength/length at end range. gait training - heel strick, STS, knee ROM and strengthening, bike    PT Home Exercise Plan  3/12: calf stretch, AAROM knee flexion, knee extension stretch, LAQs, sit to stand; 3/15 - calf stretch on  step, heel raise holds, TKE; 3/19 prone knee bends (AROM and AAROM), TKE, hip extension,  knee hangs. bridges, SAQs    Consulted and Agree with Plan of Care  Patient       Patient will benefit from skilled therapeutic intervention in order to improve the following deficits and impairments:  Abnormal gait, Pain, Difficulty walking, Decreased mobility, Decreased strength, Increased edema, Decreased skin integrity, Decreased endurance, Decreased range of motion, Decreased balance, Decreased activity tolerance  Visit Diagnosis: Decreased range of motion (ROM) of left knee  Difficulty in walking, not elsewhere classified  Muscle weakness (generalized)     Problem List Patient Active Problem List   Diagnosis Date Noted  . 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 Prostate cancer ([place]) 05/16/2016  . Abscess    12:03 PM, 05/14/19 Jerene Pitch, DPT Physical Therapy with [facility]  [phone] office  [place] [address] [place], [place], [address] Phone: [phone]   Fax:  [phone]  Name: Michael Contreras MRN: [id number] Date of Birth: 08-Sep-1950

## 2019-05-14 NOTE — Patient Instructions (Signed)
Access Code: [email or web address] URL: [email or web address] Date: 05/14/2019 Prepared by: Yetta Glassman  Exercises Prone Knee Extension Hang - 1 sets - 3 reps - 60 hold Prone Knee Flexion - 3 sets - 5 reps Prone Quadriceps Set - 3 sets - 10 reps - 5 hold Prone Knee Flexion AAROM - 2 sets - 5 reps - 10 hold Prone Hip Extension - 4 sets - 5 reps - 5 hold Supine Bridge - 5 sets - 5 reps - 5 hold Supine Knee Extension Strengthening - 3 sets - 10 reps - 3 hold

## 2019-05-17 ENCOUNTER — Encounter (HOSPITAL_COMMUNITY): Payer: Self-pay | Admitting: Physical Therapy

## 2019-05-17 ENCOUNTER — Other Ambulatory Visit: Payer: Self-pay

## 2019-05-17 ENCOUNTER — Ambulatory Visit (HOSPITAL_COMMUNITY): Payer: Medicare HMO | Admitting: Physical Therapy

## 2019-05-17 DIAGNOSIS — M25662 Stiffness of left knee, not elsewhere classified: Secondary | ICD-10-CM | POA: Diagnosis not present

## 2019-05-17 DIAGNOSIS — M6281 Muscle weakness (generalized): Secondary | ICD-10-CM | POA: Diagnosis not present

## 2019-05-17 DIAGNOSIS — R262 Difficulty in walking, not elsewhere classified: Secondary | ICD-10-CM

## 2019-05-17 NOTE — Therapy (Signed)
[place] [address] [place], [place], [address] Phone: [phone]   Fax:  [phone]  Physical Therapy Treatment  Patient Details  Name: Michael Contreras MRN: [id number] Date of Birth: June 03, 1950 Referring Provider (PT): Rodell Perna   Encounter Date: 05/17/2019  PT End of Session - 05/17/19 1310    Visit Number  5    Number of Visits  18    Date for PT Re-Evaluation  07/02/19    Authorization Time Period  POC dates 05/07/19 to 07/02/2019    Progress Note Due on Visit  10    PT Start Time  1310    PT Stop Time  1350    PT Time Calculation (min)  40 min       Past Medical History:  Diagnosis Date  . Arthritis   . History of hiatal hernia    inguinal   . Prostate cancer [facility])     Past Surgical History:  Procedure Laterality Date  . APPENDECTOMY    . HERNIA REPAIR    . LYMPHADENECTOMY Bilateral 05/16/2016   Procedure: PELVIC LYMPHADENECTOMY;  Surgeon: Raynelle Bring, MD;  Location: [facility];  Service: Urology;  Laterality: Bilateral;  . ROBOT ASSISTED LAPAROSCOPIC RADICAL PROSTATECTOMY N/A 05/16/2016   Procedure: XI ROBOTIC ASSISTED LAPAROSCOPIC RADICAL PROSTATECTOMY LEVEL 2;  Surgeon: Raynelle Bring, MD;  Location: [facility];  Service: Urology;  Laterality: N/A;  . TOTAL KNEE ARTHROPLASTY Left 04/23/2019   Procedure: LEFT TOTAL KNEE ARTHROPLASTY-CEMENTED;  Surgeon: Marybelle Killings, MD;  Location: [place];  Service: Orthopedics;  Laterality: Left;    There were no vitals filed for this visit.  Subjective Assessment - 05/17/19 1313    Subjective  States that he was pretty sore after last session (about 8/10 soreness) which was worse when he first got up to get moving.    Pertinent History  prostate cancer    Limitations  Walking;Standing;Lifting;Sitting;House hold activities    Currently in Pain?  Yes    Pain Score  8     Pain Location  Knee    Pain Orientation  Left    Pain Descriptors / Indicators  Sore    Pain Onset  1 to 4 weeks  ago         [facility] PT Assessment - 05/17/19 0001      Assessment   Medical Diagnosis  s/p left TKA    Referring Provider (PT)  Rodell Perna    Onset Date/Surgical Date  04/23/19                   [facility] Adult PT Treatment/Exercise - 05/17/19 0001      Ambulation/Gait   Ambulation/Gait  Yes    Ambulation/Gait Assistance  6: Modified independent (Device/Increase time)    Gait Comments  working on trunk rotation, arm swing, and heel toe walking, both with and without cane - 10 minutes      Knee/Hip Exercises: Stretches   Active Hamstring Stretch  Left;5 reps;20 seconds   seated with overpressure.      Knee/Hip Exercises: Machines for Strengthening   Total Gym Leg Press  3x8 L SL leg press position 18, DL leg press position 22 2x15; position 28 DL heel raise 3x10       Knee/Hip Exercises: Standing   Other Standing Knee Exercises  lateral stepping at line x5 B 15 feet      Knee/Hip Exercises: Seated   Other Seated Knee/Hip Exercises  self mobilization  to  left knee with mobilization stick - 6 minutes.       Knee/Hip Exercises: Supine   Knee Extension  AROM;Left    Knee Extension Limitations  7    lacking   Knee Flexion  AROM;Left    Knee Flexion Limitations  116               PT Short Term Goals - 05/07/19 1107      PT SHORT TERM GOAL #1   Title  Patient will be independent in HEP to improve functional outcomes    Time  4    Period  Weeks    Status  New    Target Date  06/04/19      PT SHORT TERM GOAL #2   Title  Patient will demonstrate 0-120 degrees of left knee ROM to improve functional knee mobility.    Time  4    Period  Weeks    Status  New    Target Date  06/04/19      PT SHORT TERM GOAL #3   Title  Patient will be able to ambulate at least 2 minutes without an assistive device to demonstrate improved ambulatory mobility.    Time  4    Period  Weeks    Status  New    Target Date  06/04/19        PT Long Term Goals - 05/07/19 1108       PT LONG TERM GOAL #1   Title  Patient will report at least 50% improvement in overall functional mobility.    Time  8    Period  Weeks    Status  New    Target Date  07/02/19      PT LONG TERM GOAL #2   Title  Patient will be able to ascend and descend steps with reciprocal gait pattern and with use of railing as needed to improve ability to enter/leave his home.    Time  8    Period  Weeks    Status  New    Target Date  07/02/19      PT LONG TERM GOAL #3   Time  --    Period  --    Status  --    Target Date  --            Plan - 05/17/19 1313    Clinical Impression Statement  Focused on walking and lower extremity strengthening on this date. Tolerated well but reported soreness in quad and knee end of session. TKE improving but still lacking, will add in joint mobilizations next session. Patient will continue to benefit from skilled physical therapy focusing on improving knee ROM and strength.    Examination-Activity Limitations  Bend;Squat;Sit;Stand;Stairs;Transfers;Lift;Locomotion Level    Examination-Participation Restrictions  Cleaning;Driving;Yard Work;Community Activity    Stability/Clinical Decision Making  Stable/Uncomplicated    Rehab Potential  Excellent    PT Frequency  Other (comment)   3x/week for 2 weeks then 2x/week for 6 weeks   PT Duration  8 weeks    PT Treatment/Interventions  ADLs/Self Care Home Management;Aquatic Therapy;Cryotherapy;Electrical Stimulation;Traction;Balance training;Moist Heat;Therapeutic exercise;Therapeutic activities;Functional mobility training;Stair training;Gait training;Neuromuscular re-education;Patient/family education;Manual techniques;Dry needling;Passive range of motion;Scar mobilization;Joint Manipulations    PT Next Visit Plan  manual for end range extension, focus on TKE -calf and quad strength/length at end range. gait training - heel strick, STS, knee ROM and strengthening, bike    PT Home Exercise Plan  3/12: calf  stretch, AAROM knee flexion, knee extension stretch, LAQs, sit to stand; 3/15 - calf stretch on step, heel raise holds, TKE; 3/19 prone knee bends (AROM and AAROM), TKE, hip extension, knee hangs. bridges, SAQs    Consulted and Agree with Plan of Care  Patient       Patient will benefit from skilled therapeutic intervention in order to improve the following deficits and impairments:  Abnormal gait, Pain, Difficulty walking, Decreased mobility, Decreased strength, Increased edema, Decreased skin integrity, Decreased endurance, Decreased range of motion, Decreased balance, Decreased activity tolerance  Visit Diagnosis: Decreased range of motion (ROM) of left knee  Difficulty in walking, not elsewhere classified  Muscle weakness (generalized)     Problem List Patient Active Problem List   Diagnosis Date Noted  . 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 Prostate cancer ([place]) 05/16/2016  . Abscess    2:06 PM, 05/17/19 Jerene Pitch, DPT Physical Therapy with [facility]  [phone] office  [place] [address] [place], [place], [address] Phone: [phone]   Fax:  [phone]  Name: Michael Contreras MRN: [id number] Date of Birth: 12/10/1950

## 2019-05-19 ENCOUNTER — Encounter (HOSPITAL_COMMUNITY): Payer: Self-pay | Admitting: Physical Therapy

## 2019-05-19 ENCOUNTER — Other Ambulatory Visit: Payer: Self-pay

## 2019-05-19 ENCOUNTER — Ambulatory Visit (HOSPITAL_COMMUNITY): Payer: Medicare HMO | Admitting: Physical Therapy

## 2019-05-19 DIAGNOSIS — M6281 Muscle weakness (generalized): Secondary | ICD-10-CM | POA: Diagnosis not present

## 2019-05-19 DIAGNOSIS — M25662 Stiffness of left knee, not elsewhere classified: Secondary | ICD-10-CM

## 2019-05-19 DIAGNOSIS — R262 Difficulty in walking, not elsewhere classified: Secondary | ICD-10-CM

## 2019-05-19 NOTE — Therapy (Signed)
[place], [place], [address] Phone: [phone]   Fax:  [phone]  Physical Therapy Treatment  Patient Details  Name: Michael Contreras MRN: [id number] Date of Birth: 1950/10/25 Referring Provider (PT): Rodell Perna   Encounter Date: 05/19/2019  PT End of Session - 05/19/19 1242    Visit Number  6    Number of Visits  18    Date for PT Re-Evaluation  07/02/19    Authorization Time Period  POC dates 05/07/19 to 07/02/2019    Progress Note Due on Visit  10    PT Start Time  1305    PT Stop Time  1345    PT Time Calculation (min)  40 min       Past Medical History:  Diagnosis Date  . Arthritis   . History of hiatal hernia    inguinal   . Prostate cancer [facility])     Past Surgical History:  Procedure Laterality Date  . APPENDECTOMY    . HERNIA REPAIR    . LYMPHADENECTOMY Bilateral 05/16/2016   Procedure: PELVIC LYMPHADENECTOMY;  Surgeon: Raynelle Bring, MD;  [facility];  Service: Urology;  Laterality: Bilateral;  . ROBOT ASSISTED LAPAROSCOPIC RADICAL PROSTATECTOMY N/A 05/16/2016   Procedure: XI ROBOTIC ASSISTED LAPAROSCOPIC RADICAL PROSTATECTOMY LEVEL 2;  Surgeon: Raynelle Bring, MD;  [facility];  Service: Urology;  Laterality: N/A;  . TOTAL KNEE ARTHROPLASTY Left 04/23/2019   Procedure: LEFT TOTAL KNEE ARTHROPLASTY-CEMENTED;  Surgeon: Marybelle Killings, MD;  Location: [place];  Service: Orthopedics;  Laterality: Left;    There were no vitals filed for this visit.  Subjective Assessment - 05/19/19 1300    Subjective  States he was sore after last session but reports no pain.    Pertinent History  prostate cancer    Limitations  Walking;Standing;Lifting;Sitting;House hold activities    Currently in Pain?  No/denies    Pain Onset  1 to 4 weeks ago         [facility] PT Assessment - 05/19/19 0001      Assessment   Medical Diagnosis  s/p left TKA    Referring Provider (PT)  Rodell Perna    Onset  Date/Surgical Date  04/23/19                   [facility] Adult PT Treatment/Exercise - 05/19/19 0001      Knee/Hip Exercises: Stretches   Other Knee/Hip Stretches  lunge stretch and knee extension stretch on 12 inch step 20" holds E L x3       Knee/Hip Exercises: Aerobic   Stationary Bike  2 minutes at position 13 and then 3 minutes with level 4 resistance       Knee/Hip Exercises: Standing   Other Standing Knee Exercises  lateral steping x5 B 15 feet     Other Standing Knee Exercises  semi SLS stance - soft knee vector taps B 3x5 UE assist in // bars       Knee/Hip Exercises: Supine   Knee Extension  AROM;Left    Knee Extension Limitations  4   lacking   Knee Flexion  AROM;Left    Knee Flexion Limitations  122      Manual Therapy   Manual Therapy  Edema management;Joint mobilization    Manual therapy comments  all manaul interventions performed independently of other interventions    Edema Management  retrograde edema mobilization with leg elevated on bolster L -  6 minutes    Joint Mobilization  left patellar mobilizations in all directions, left tibial mobilizations to promote knee extension PA grade III tolerated well                 PT Short Term Goals - 05/07/19 1107      PT SHORT TERM GOAL #1   Title  Patient will be independent in HEP to improve functional outcomes    Time  4    Period  Weeks    Status  New    Target Date  06/04/19      PT SHORT TERM GOAL #2   Title  Patient will demonstrate 0-120 degrees of left knee ROM to improve functional knee mobility.    Time  4    Period  Weeks    Status  New    Target Date  06/04/19      PT SHORT TERM GOAL #3   Title  Patient will be able to ambulate at least 2 minutes without an assistive device to demonstrate improved ambulatory mobility.    Time  4    Period  Weeks    Status  New    Target Date  06/04/19        PT Long Term Goals - 05/07/19 1108      PT LONG TERM GOAL #1   Title  Patient  will report at least 50% improvement in overall functional mobility.    Time  8    Period  Weeks    Status  New    Target Date  07/02/19      PT LONG TERM GOAL #2   Title  Patient will be able to ascend and descend steps with reciprocal gait pattern and with use of railing as needed to improve ability to enter/leave his home.    Time  8    Period  Weeks    Status  New    Target Date  07/02/19      PT LONG TERM GOAL #3   Time  --    Period  --    Status  --    Target Date  --            Plan - 05/19/19 1300    Clinical Impression Statement  Focused on standing strengthening and terminal knee extension. Improved knee extension noted on this date after manual mobilizations and edema massage. No pain noted during today's session. Patient will continue to benefit from skilled physical therapy focusing on improving knee mobility, functional endurance and strength as tolerated.    Examination-Activity Limitations  Bend;Squat;Sit;Stand;Stairs;Transfers;Lift;Locomotion Level    Examination-Participation Restrictions  Cleaning;Driving;Yard Work;Community Activity    Stability/Clinical Decision Making  Stable/Uncomplicated    Rehab Potential  Excellent    PT Frequency  Other (comment)   3x/week for 2 weeks then 2x/week for 6 weeks   PT Duration  8 weeks    PT Treatment/Interventions  ADLs/Self Care Home Management;Aquatic Therapy;Cryotherapy;Electrical Stimulation;Traction;Balance training;Moist Heat;Therapeutic exercise;Therapeutic activities;Functional mobility training;Stair training;Gait training;Neuromuscular re-education;Patient/family education;Manual techniques;Dry needling;Passive range of motion;Scar mobilization;Joint Manipulations    PT Next Visit Plan  manual for end range extension, focus on TKE -calf and quad strength/length at end range. gait training - heel strick, STS, knee ROM and strengthening, bike    PT Home Exercise Plan  3/12: calf stretch, AAROM knee flexion, knee  extension stretch, LAQs, sit to stand; 3/15 - calf stretch on step, heel raise holds, TKE; 3/19 prone knee bends (  AROM and AAROM), TKE, hip extension, knee hangs. bridges, SAQs    Consulted and Agree with Plan of Care  Patient       Patient will benefit from skilled therapeutic intervention in order to improve the following deficits and impairments:  Abnormal gait, Pain, Difficulty walking, Decreased mobility, Decreased strength, Increased edema, Decreased skin integrity, Decreased endurance, Decreased range of motion, Decreased balance, Decreased activity tolerance  Visit Diagnosis: Decreased range of motion (ROM) of left knee  Difficulty in walking, not elsewhere classified  Muscle weakness (generalized)     Problem List Patient Active Problem List   Diagnosis Date Noted  . 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 Prostate cancer ([place]) 05/16/2016  . Abscess   1:54 PM, 05/19/19 Jerene Pitch, DPT Physical Therapy with [facility]  [phone] office  [place] [address] [place], [place], [address] Phone: [phone]   Fax:  [phone]  Name: Michael Contreras MRN: [id number] Date of Birth: Jan 01, 1951

## 2019-05-21 ENCOUNTER — Telehealth: Payer: Self-pay | Admitting: *Deleted

## 2019-05-21 ENCOUNTER — Ambulatory Visit (HOSPITAL_COMMUNITY): Payer: Medicare HMO | Admitting: Physical Therapy

## 2019-05-21 NOTE — Telephone Encounter (Signed)
30 day Ortho bundle call completed.

## 2019-05-24 ENCOUNTER — Other Ambulatory Visit: Payer: Self-pay

## 2019-05-24 ENCOUNTER — Encounter (HOSPITAL_COMMUNITY): Payer: Self-pay | Admitting: Physical Therapy

## 2019-05-24 ENCOUNTER — Ambulatory Visit (HOSPITAL_COMMUNITY): Payer: Medicare HMO | Admitting: Physical Therapy

## 2019-05-24 DIAGNOSIS — R262 Difficulty in walking, not elsewhere classified: Secondary | ICD-10-CM

## 2019-05-24 DIAGNOSIS — M25662 Stiffness of left knee, not elsewhere classified: Secondary | ICD-10-CM

## 2019-05-24 DIAGNOSIS — M6281 Muscle weakness (generalized): Secondary | ICD-10-CM

## 2019-05-24 NOTE — Therapy (Signed)
[place], [place], [address] Phone: [phone]   Fax:  [phone]  Physical Therapy Treatment  Patient Details  Name: Michael Contreras MRN: [id number] Date of Birth: 1950/10/19 Referring Provider (PT): Rodell Perna   Encounter Date: 05/24/2019  PT End of Session - 05/24/19 1250    Visit Number  7    Number of Visits  18    Date for PT Re-Evaluation  07/02/19    Authorization Time Period  POC dates 05/07/19 to 07/02/2019    Authorization - Number of Visits  12    Progress Note Due on Visit  10    PT Start Time  1300    PT Stop Time  1345    PT Time Calculation (min)  45 min       Past Medical History:  Diagnosis Date  . Arthritis   . History of hiatal hernia    inguinal   . Prostate cancer [facility])     Past Surgical History:  Procedure Laterality Date  . APPENDECTOMY    . HERNIA REPAIR    . LYMPHADENECTOMY Bilateral 05/16/2016   Procedure: PELVIC LYMPHADENECTOMY;  Surgeon: Raynelle Bring, MD;  [facility];  Service: Urology;  Laterality: Bilateral;  . ROBOT ASSISTED LAPAROSCOPIC RADICAL PROSTATECTOMY N/A 05/16/2016   Procedure: XI ROBOTIC ASSISTED LAPAROSCOPIC RADICAL PROSTATECTOMY LEVEL 2;  Surgeon: Raynelle Bring, MD;  [facility];  Service: Urology;  Laterality: N/A;  . TOTAL KNEE ARTHROPLASTY Left 04/23/2019   Procedure: LEFT TOTAL KNEE ARTHROPLASTY-CEMENTED;  Surgeon: Marybelle Killings, MD;  Location: [place];  Service: Orthopedics;  Laterality: Left;    There were no vitals filed for this visit.  Subjective Assessment - 05/24/19 1304    Subjective  States he is feeling good. States that he was sore for about a day and a half after last session but now feels great.    Pertinent History  prostate cancer    Limitations  Walking;Standing;Lifting;Sitting;House hold activities    Currently in Pain?  No/denies    Pain Onset  1 to 4 weeks ago         [facility] PT Assessment - 05/24/19 0001      Assessment   Medical Diagnosis  s/p left TKA    Referring Provider (PT)  Rodell Perna    Onset Date/Surgical Date  04/23/19                   [facility] Adult PT Treatment/Exercise - 05/24/19 0001      Knee/Hip Exercises: Stretches   Other Knee/Hip Stretches  lunge stretch and knee extension stretch on 12 inch step 20" holds E L x3       Knee/Hip Exercises: Standing   Other Standing Knee Exercises  walking on balance beam tandem x6 and lateral 2x3 B, lateral stepping with green TB - x5 of 15 feet B - cues to keep upright trunk    Other Standing Knee Exercises  crossover step ups 6" step  3x10 B - with one UE assist       Knee/Hip Exercises: Seated   Sit to Sand  3 sets;10 reps;without UE support   with green band - elevated chair - blue foam               PT Short Term Goals - 05/07/19 1107      PT SHORT TERM GOAL #1   Title  Patient will be independent in HEP to  improve functional outcomes    Time  4    Period  Weeks    Status  New    Target Date  06/04/19      PT SHORT TERM GOAL #2   Title  Patient will demonstrate 0-120 degrees of left knee ROM to improve functional knee mobility.    Time  4    Period  Weeks    Status  New    Target Date  06/04/19      PT SHORT TERM GOAL #3   Title  Patient will be able to ambulate at least 2 minutes without an assistive device to demonstrate improved ambulatory mobility.    Time  4    Period  Weeks    Status  New    Target Date  06/04/19        PT Long Term Goals - 05/07/19 1108      PT LONG TERM GOAL #1   Title  Patient will report at least 50% improvement in overall functional mobility.    Time  8    Period  Weeks    Status  New    Target Date  07/02/19      PT LONG TERM GOAL #2   Title  Patient will be able to ascend and descend steps with reciprocal gait pattern and with use of railing as needed to improve ability to enter/leave his home.    Time  8    Period  Weeks    Status  New    Target Date   07/02/19      PT LONG TERM GOAL #3   Time  --    Period  --    Status  --    Target Date  --            Plan - 05/24/19 1329    Clinical Impression Statement  Added crossover step ups for hip strengthening and this was tolerated moderately well. Mild pain noted on left side with left step down. Continued swelling noted on left side. Re-educated patient on swelling and how it will increase when leg is in dependent position and not actively moving (sitting  and standing still). Continued to lack TKE but this seems to be improving, most likely related to continued swelling. Will follow up with swelling and TKE next session.    Examination-Activity Limitations  Bend;Squat;Sit;Stand;Stairs;Transfers;Lift;Locomotion Level    Examination-Participation Restrictions  Cleaning;Driving;Yard Work;Community Activity    Stability/Clinical Decision Making  Stable/Uncomplicated    Rehab Potential  Excellent    PT Frequency  Other (comment)   3x/week for 2 weeks then 2x/week for 6 weeks   PT Duration  8 weeks    PT Treatment/Interventions  ADLs/Self Care Home Management;Aquatic Therapy;Cryotherapy;Electrical Stimulation;Traction;Balance training;Moist Heat;Therapeutic exercise;Therapeutic activities;Functional mobility training;Stair training;Gait training;Neuromuscular re-education;Patient/family education;Manual techniques;Dry needling;Passive range of motion;Scar mobilization;Joint Manipulations    PT Next Visit Plan  manual for end range extension, focus on TKE -calf and quad strength/length at end range. gait training - heel strick, STS, knee ROM and strengthening, bike    PT Home Exercise Plan  3/12: calf stretch, AAROM knee flexion, knee extension stretch, LAQs, sit to stand; 3/15 - calf stretch on step, heel raise holds, TKE; 3/19 prone knee bends (AROM and AAROM), TKE, hip extension, knee hangs. bridges, SAQs    Consulted and Agree with Plan of Care  Patient       Patient will benefit from  skilled therapeutic intervention in order to improve  the following deficits and impairments:  Abnormal gait, Pain, Difficulty walking, Decreased mobility, Decreased strength, Increased edema, Decreased skin integrity, Decreased endurance, Decreased range of motion, Decreased balance, Decreased activity tolerance  Visit Diagnosis: Decreased range of motion (ROM) of left knee  Difficulty in walking, not elsewhere classified  Muscle weakness (generalized)     Problem List Patient Active Problem List   Diagnosis Date Noted  . 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 Prostate cancer ([place]) 05/16/2016  . Abscess     1:45 PM, 05/24/19 Jerene Pitch, DPT Physical Therapy with [facility]  [phone] office   [place] [address] [place], [place], [address] Phone: [phone]   Fax:  [phone]  Name: Michael Contreras MRN: [id number] Date of Birth: [birth year]

## 2019-05-26 ENCOUNTER — Other Ambulatory Visit: Payer: Self-pay

## 2019-05-26 ENCOUNTER — Encounter (HOSPITAL_COMMUNITY): Payer: Self-pay | Admitting: Physical Therapy

## 2019-05-26 ENCOUNTER — Ambulatory Visit (HOSPITAL_COMMUNITY): Payer: Medicare HMO | Admitting: Physical Therapy

## 2019-05-26 DIAGNOSIS — M6281 Muscle weakness (generalized): Secondary | ICD-10-CM | POA: Diagnosis not present

## 2019-05-26 DIAGNOSIS — M25662 Stiffness of left knee, not elsewhere classified: Secondary | ICD-10-CM

## 2019-05-26 DIAGNOSIS — R262 Difficulty in walking, not elsewhere classified: Secondary | ICD-10-CM | POA: Diagnosis not present

## 2019-05-26 NOTE — Therapy (Signed)
[place], [place], [address] Phone: [phone]   Fax:  [phone]  Physical Therapy Treatment  Patient Details  Name: Michael Contreras MRN: [id number] Date of Birth: 1950-07-15 Referring Provider (PT): Rodell Perna   Encounter Date: 05/26/2019  PT End of Session - 05/26/19 1310    Visit Number  8    Number of Visits  18    Date for PT Re-Evaluation  07/02/19    Authorization Time Period  POC dates 05/07/19 to 07/02/2019    Authorization - Number of Visits  12    Progress Note Due on Visit  10    PT Start Time  1310    PT Stop Time  1350    PT Time Calculation (min)  40 min    Activity Tolerance  Patient tolerated treatment well       Past Medical History:  Diagnosis Date  . Arthritis   . History of hiatal hernia    inguinal   . Prostate cancer [facility])     Past Surgical History:  Procedure Laterality Date  . APPENDECTOMY    . HERNIA REPAIR    . LYMPHADENECTOMY Bilateral 05/16/2016   Procedure: PELVIC LYMPHADENECTOMY;  Surgeon: Raynelle Bring, MD;  Location: [facility];  Service: Urology;  Laterality: Bilateral;  . ROBOT ASSISTED LAPAROSCOPIC RADICAL PROSTATECTOMY N/A 05/16/2016   Procedure: XI ROBOTIC ASSISTED LAPAROSCOPIC RADICAL PROSTATECTOMY LEVEL 2;  Surgeon: Raynelle Bring, MD;  Location: [facility];  Service: Urology;  Laterality: N/A;  . TOTAL KNEE ARTHROPLASTY Left 04/23/2019   Procedure: LEFT TOTAL KNEE ARTHROPLASTY-CEMENTED;  Surgeon: Marybelle Killings, MD;  Location: [place];  Service: Orthopedics;  Laterality: Left;    There were no vitals filed for this visit.  Subjective Assessment - 05/26/19 1327    Subjective  States he is still a little sore from last session but no real pain. States he feels his straightening with walking has improved.    Pertinent History  prostate cancer    Limitations  Walking;Standing;Lifting;Sitting;House hold activities    Pain Onset  1 to 4 weeks ago         [facility] PT  Assessment - 05/26/19 0001      Assessment   Medical Diagnosis  s/p left TKA    Referring Provider (PT)  Rodell Perna    Onset Date/Surgical Date  04/23/19    Next MD Visit  06/03/19                   [facility] Adult PT Treatment/Exercise - 05/26/19 0001      Ambulation/Gait   Stairs  Yes    Stairs Assistance  6: Modified independent (Device/Increase time)    Stair Management Technique  One rail Right;Alternating pattern;Forwards    Number of Stairs  4    Height of Stairs  7    Gait Comments  2x 5 of stairs      Knee/Hip Exercises: Standing   Heel Raises  Both;5 reps   slower lower, double leg, 5 sets   Functional Squat  5 sets;5 reps      Knee/Hip Exercises: Seated   Other Seated Knee/Hip Exercises  self mobilization to  left knee with mobilization stick - 5 minutes.       Knee/Hip Exercises: Supine   Bridges  AROM;Strengthening;3 sets;10 reps    Knee Extension  AROM;Left    Knee Extension Limitations  4    Knee Flexion  AROM;Left  Knee Flexion Limitations  120      Manual Therapy   Manual Therapy  Soft tissue mobilization    Manual therapy comments  all manaul interventions performed independently of other interventions    Joint Mobilization  left patellar mobilizations in all directions, left tibial mobilizations to promote knee extension PA grade III tolerated well      Soft tissue mobilization  STM to left knee and surrounding musculature - less pain noted afterwards               PT Short Term Goals - 05/07/19 1107      PT SHORT TERM GOAL #1   Title  Patient will be independent in HEP to improve functional outcomes    Time  4    Period  Weeks    Status  New    Target Date  06/04/19      PT SHORT TERM GOAL #2   Title  Patient will demonstrate 0-120 degrees of left knee ROM to improve functional knee mobility.    Time  4    Period  Weeks    Status  New    Target Date  06/04/19      PT SHORT TERM GOAL #3   Title  Patient will be able to  ambulate at least 2 minutes without an assistive device to demonstrate improved ambulatory mobility.    Time  4    Period  Weeks    Status  New    Target Date  06/04/19        PT Long Term Goals - 05/07/19 1108      PT LONG TERM GOAL #1   Title  Patient will report at least 50% improvement in overall functional mobility.    Time  8    Period  Weeks    Status  New    Target Date  07/02/19      PT LONG TERM GOAL #2   Title  Patient will be able to ascend and descend steps with reciprocal gait pattern and with use of railing as needed to improve ability to enter/leave his home.    Time  8    Period  Weeks    Status  New    Target Date  07/02/19      PT LONG TERM GOAL #3   Time  --    Period  --    Status  --    Target Date  --            Plan - 05/26/19 1353    Clinical Impression Statement  Improved TKE noted with ambulation. Focus today was on strengthening and improving TKE. This was tolerated well. Fatigue in muscle noted end of session and minor soreness.  Instructed patient to perform self mobilization at home to help with post workout soreness.    Examination-Activity Limitations  Bend;Squat;Sit;Stand;Stairs;Transfers;Lift;Locomotion Level    Examination-Participation Restrictions  Cleaning;Driving;Yard Work;Community Activity    Stability/Clinical Decision Making  Stable/Uncomplicated    Rehab Potential  Excellent    PT Frequency  Other (comment)   3x/week for 2 weeks then 2x/week for 6 weeks   PT Duration  8 weeks    PT Treatment/Interventions  ADLs/Self Care Home Management;Aquatic Therapy;Cryotherapy;Electrical Stimulation;Traction;Balance training;Moist Heat;Therapeutic exercise;Therapeutic activities;Functional mobility training;Stair training;Gait training;Neuromuscular re-education;Patient/family education;Manual techniques;Dry needling;Passive range of motion;Scar mobilization;Joint Manipulations    PT Next Visit Plan  manual for end range extension, focus  on TKE -calf and quad strength/length at end  range. gait training - heel strick, STS, knee ROM and strengthening, bike    PT Home Exercise Plan  3/12: calf stretch, AAROM knee flexion, knee extension stretch, LAQs, sit to stand; 3/15 - calf stretch on step, heel raise holds, TKE; 3/19 prone knee bends (AROM and AAROM), TKE, hip extension, knee hangs. bridges, SAQs    Consulted and Agree with Plan of Care  Patient       Patient will benefit from skilled therapeutic intervention in order to improve the following deficits and impairments:  Abnormal gait, Pain, Difficulty walking, Decreased mobility, Decreased strength, Increased edema, Decreased skin integrity, Decreased endurance, Decreased range of motion, Decreased balance, Decreased activity tolerance  Visit Diagnosis: Decreased range of motion (ROM) of left knee  Muscle weakness (generalized)  Difficulty in walking, not elsewhere classified     Problem List Patient Active Problem List   Diagnosis Date Noted  . 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 Prostate cancer ([place]) 05/16/2016  . Abscess    1:54 PM, 05/26/19 Jerene Pitch, DPT Physical Therapy with [facility]  [phone] office  [place] [address] [place], [place], [address] Phone: [phone]   Fax:  [phone]  Name: Michael Contreras MRN: [id number] Date of Birth: 1950-04-09

## 2019-05-28 ENCOUNTER — Encounter (HOSPITAL_COMMUNITY): Payer: Medicare HMO | Admitting: Physical Therapy

## 2019-06-02 ENCOUNTER — Other Ambulatory Visit: Payer: Self-pay

## 2019-06-02 ENCOUNTER — Ambulatory Visit (HOSPITAL_COMMUNITY): Payer: Medicare HMO | Attending: Orthopaedic Surgery | Admitting: Physical Therapy

## 2019-06-02 ENCOUNTER — Encounter (HOSPITAL_COMMUNITY): Payer: Self-pay | Admitting: Physical Therapy

## 2019-06-02 DIAGNOSIS — R262 Difficulty in walking, not elsewhere classified: Secondary | ICD-10-CM

## 2019-06-02 DIAGNOSIS — M6281 Muscle weakness (generalized): Secondary | ICD-10-CM | POA: Diagnosis not present

## 2019-06-02 DIAGNOSIS — M25662 Stiffness of left knee, not elsewhere classified: Secondary | ICD-10-CM | POA: Insufficient documentation

## 2019-06-02 NOTE — Therapy (Signed)
[place] [address] [place], [place], [address] Phone: [phone]   Fax:  [phone]  Physical Therapy Treatment and Progress Note  Patient Details  Name: Michael Contreras MRN: [id number] Date of Birth: 1950-05-17 Referring Provider (PT): Rodell Perna  Progress Note Reporting Period 05/07/10 to 06/02/19  See note below for Objective Data and Assessment of Progress/Goals.       Encounter Date: 06/02/2019  PT End of Session - 06/02/19 1314    Visit Number  9    Number of Visits  18    Date for PT Re-Evaluation  07/02/19    Authorization Type  12 visits authorized    Authorization Time Period  POC dates 05/07/19 to 07/02/2019    Authorization - Number of Visits  12    Progress Note Due on Visit  73    PT Start Time  1315    PT Stop Time  1355    PT Time Calculation (min)  40 min    Activity Tolerance  Patient tolerated treatment well       Past Medical History:  Diagnosis Date  . Arthritis   . History of hiatal hernia    inguinal   . Prostate cancer [facility])     Past Surgical History:  Procedure Laterality Date  . APPENDECTOMY    . HERNIA REPAIR    . LYMPHADENECTOMY Bilateral 05/16/2016   Procedure: PELVIC LYMPHADENECTOMY;  Surgeon: Raynelle Bring, MD;  [facility];  Service: Urology;  Laterality: Bilateral;  . ROBOT ASSISTED LAPAROSCOPIC RADICAL PROSTATECTOMY N/A 05/16/2016   Procedure: XI ROBOTIC ASSISTED LAPAROSCOPIC RADICAL PROSTATECTOMY LEVEL 2;  Surgeon: Raynelle Bring, MD;  [facility];  Service: Urology;  Laterality: N/A;  . TOTAL KNEE ARTHROPLASTY Left 04/23/2019   Procedure: LEFT TOTAL KNEE ARTHROPLASTY-CEMENTED;  Surgeon: Marybelle Killings, MD;  Location: [place];  Service: Orthopedics;  Laterality: Left;    There were no vitals filed for this visit.  Subjective Assessment - 06/02/19 1320    Subjective  States that overall feels 75% better. Still sore in muscles from last session.    Pertinent History  prostate  cancer    Limitations  Walking;Standing;Lifting;Sitting;House hold activities    Currently in Pain?  No/denies    Pain Onset  1 to 4 weeks ago         [facility] PT Assessment - 06/02/19 0001      Assessment   Medical Diagnosis  s/p left TKA    Referring Provider (PT)  Rodell Perna    Onset Date/Surgical Date  04/23/19    Next MD Visit  06/03/19      Observation/Other Assessments   Focus on Therapeutic Outcomes (FOTO)   37% limited    was 54% limited                  [facility] Adult PT Treatment/Exercise - 06/02/19 0001      Ambulation/Gait   Ambulation/Gait  Yes    Ambulation/Gait Assistance  7: Independent    Ambulation Distance (Feet)  466 Feet    Assistive device  None    Gait Pattern  Decreased dorsiflexion - left    Stairs  Yes    Stairs Assistance  7: Independent    Stair Management Technique  One rail Right    Number of Stairs  4    Height of Stairs  4    Gait Comments  2x5 of stairs, 2MW for walking  Knee/Hip Exercises: Stretches   Other Knee/Hip Stretches  lunge stretch and knee extension stretch on 12 inch step 20" holds E L x3       Knee/Hip Exercises: Standing   Other Standing Knee Exercises  tandem on foam 2x30" B       Knee/Hip Exercises: Seated   Other Seated Knee/Hip Exercises  self mobilization to left foot with lacrosse ball 3 minutes      Knee/Hip Exercises: Supine   Knee Extension  AROM;Left    Knee Extension Limitations  4   lacking   Knee Flexion  AROM;Left    Knee Flexion Limitations  122      Manual Therapy   Manual Therapy  Edema management    Manual therapy comments  all manaul interventions performed independently of other interventions    Edema Management  retrograde edema mobilization with leg elevated on bolster L - 6 minutes               PT Short Term Goals - 06/02/19 1320      PT SHORT TERM GOAL #1   Title  Patient will be independent in HEP to improve functional outcomes    Time  4    Period  Weeks     Status  Achieved    Target Date  06/04/19      PT SHORT TERM GOAL #2   Title  Patient will demonstrate 0-120 degrees of left knee ROM to improve functional knee mobility.    Baseline  not met see objective section    Time  4    Period  Weeks    Status  New    Target Date  06/04/19      PT SHORT TERM GOAL #3   Title  Patient will be able to ambulate at least 2 minutes without an assistive device to demonstrate improved ambulatory mobility.    Time  4    Period  Weeks    Status  Achieved    Target Date  06/04/19        PT Long Term Goals - 06/02/19 1321      PT LONG TERM GOAL #1   Title  Patient will report at least 50% improvement in overall functional mobility.    Baseline  75% improvement    Time  8    Period  Weeks    Status  Achieved      PT LONG TERM GOAL #2   Title  Patient will be able to ascend and descend steps with reciprocal gait pattern and with use of railing as needed to improve ability to enter/leave his home.    Baseline  able to perform    Time  8    Period  Weeks    Status  Achieved            Plan - 06/02/19 1315    Clinical Impression Statement  Patient present for a progress note on this date. He is making excellent progress and has met 2/3 short term goals and 2/2 long term goals at this time. He continues to improve in functional mobility and improved on his FOTO score by 17%. Terminal knee extension is still lacking but most likely due to continued swelling. Instructed patient to continue to work on elevation to help with residual swelling.    Examination-Activity Limitations  Bend;Squat;Sit;Stand;Stairs;Transfers;Lift;Locomotion Level    Examination-Participation Restrictions  Cleaning;Driving;Yard Work;Community Activity    Stability/Clinical Decision Making  Stable/Uncomplicated  Rehab Potential  Excellent    PT Frequency  Other (comment)   3x/week for 2 weeks then 2x/week for 6 weeks   PT Duration  8 weeks    PT Treatment/Interventions   ADLs/Self Care Home Management;Aquatic Therapy;Cryotherapy;Electrical Stimulation;Traction;Balance training;Moist Heat;Therapeutic exercise;Therapeutic activities;Functional mobility training;Stair training;Gait training;Neuromuscular re-education;Patient/family education;Manual techniques;Dry needling;Passive range of motion;Scar mobilization;Joint Manipulations    PT Next Visit Plan  f/u with patient about MD visit on 4/8. manual for end range extension, focus on TKE -calf and quad strength/length at end range. gait training - heel strick, STS, knee ROM and strengthening, bike    PT Home Exercise Plan  3/12: calf stretch, AAROM knee flexion, knee extension stretch, LAQs, sit to stand; 3/15 - calf stretch on step, heel raise holds, TKE; 3/19 prone knee bends (AROM and AAROM), TKE, hip extension, knee hangs. bridges, SAQs    Consulted and Agree with Plan of Care  Patient       Patient will benefit from skilled therapeutic intervention in order to improve the following deficits and impairments:  Abnormal gait, Pain, Difficulty walking, Decreased mobility, Decreased strength, Increased edema, Decreased skin integrity, Decreased endurance, Decreased range of motion, Decreased balance, Decreased activity tolerance  Visit Diagnosis: Decreased range of motion (ROM) of left knee  Muscle weakness (generalized)  Difficulty in walking, not elsewhere classified     Problem List Patient Active Problem List   Diagnosis Date Noted  . 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 Prostate cancer ([place]) 05/16/2016  . Abscess    3:24 PM, 06/02/19 Jerene Pitch, DPT Physical Therapy with [facility]  [phone] office  [place] [address] [place], [place], [address] Phone: [phone]   Fax:  [phone]  Name: [place] Contreras MRN: [id number] Date of Birth: [birth year]

## 2019-06-03 ENCOUNTER — Telehealth (HOSPITAL_COMMUNITY): Payer: Self-pay | Admitting: Physical Therapy

## 2019-06-03 ENCOUNTER — Ambulatory Visit (INDEPENDENT_AMBULATORY_CARE_PROVIDER_SITE_OTHER): Payer: Medicare HMO | Admitting: Orthopaedic Surgery

## 2019-06-03 ENCOUNTER — Encounter: Payer: Self-pay | Admitting: Orthopaedic Surgery

## 2019-06-03 VITALS — BP 113/63 | HR 67 | Ht 75.0 in | Wt 202.0 lb

## 2019-06-03 DIAGNOSIS — Z96652 Presence of left artificial knee joint: Secondary | ICD-10-CM

## 2019-06-03 NOTE — Progress Notes (Signed)
   Post-Op Visit Note   Patient: [place] Contreras           Date of Birth: 1950-04-04           MRN: [id number] Visit Date: 06/03/2019 PCP: Neale Burly, MD   Assessment & Plan: Post left total knee arthroplasty 04/23/2019.  He has a few more therapy visits.  He is on baby aspirin daily.  Has trace swelling in his knee near full extension might lacks 1 or 2 degrees.  Flexes to 120 amatory without a cane no limp.  He is not on any pain medication is very happy with the surgical result.  Chief Complaint:  Chief Complaint  Patient presents with  . Left Knee - Follow-up    04/23/2019 Left TKA   Visit Diagnoses:  1. S/P total knee arthroplasty, left     Plan: Patient will finish his last few therapy visits continue home exercise program return to see me as needed.  He is very happy with results of his surgeries.  Follow-Up Instructions: Return if symptoms worsen or fail to improve.   Orders:  No orders of the defined types were placed in this encounter.  No orders of the defined types were placed in this encounter.   Imaging: No results found.  PMFS History: Patient Active Problem List   Diagnosis Date Noted  . 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 Prostate cancer ([place]) 05/16/2016  . Abscess    Past Medical History:  Diagnosis Date  . Arthritis   . History of hiatal hernia    inguinal   . Prostate cancer ([place])     No family history on file.  Past Surgical History:  Procedure Laterality Date  . APPENDECTOMY    . HERNIA REPAIR    . LYMPHADENECTOMY Bilateral 05/16/2016   Procedure: PELVIC LYMPHADENECTOMY;  Surgeon: Raynelle Bring, MD;  [facility];  Service: Urology;  Laterality: Bilateral;  . ROBOT ASSISTED LAPAROSCOPIC RADICAL PROSTATECTOMY N/A 05/16/2016   Procedure: XI ROBOTIC ASSISTED LAPAROSCOPIC RADICAL PROSTATECTOMY LEVEL 2;  Surgeon: Raynelle Bring, MD;  [facility];  Service: Urology;  Laterality: N/A;  . TOTAL KNEE ARTHROPLASTY Left  04/23/2019   Procedure: LEFT TOTAL KNEE ARTHROPLASTY-CEMENTED;  Surgeon: Marybelle Killings, MD;  Location: [place];  Service: Orthopedics;  Laterality: Left;   Social History   Occupational History  . Not on file  Tobacco Use  . Smoking status: Former Smoker    Packs/day: 2.00    Types: Cigarettes    Quit date: 01/05/2013    Years since quitting: 6.4  . Smokeless tobacco: Never Used  Substance and Sexual Activity  . Alcohol use: No  . Drug use: No  . Sexual activity: Not on file

## 2019-06-03 NOTE — Telephone Encounter (Signed)
pt cancelled appt for 4/9 because he has a COVID shot at the same time

## 2019-06-04 ENCOUNTER — Ambulatory Visit (HOSPITAL_COMMUNITY): Payer: Medicare HMO | Admitting: Physical Therapy

## 2019-06-07 ENCOUNTER — Encounter (HOSPITAL_COMMUNITY): Payer: Self-pay | Admitting: Physical Therapy

## 2019-06-07 ENCOUNTER — Other Ambulatory Visit: Payer: Self-pay

## 2019-06-07 ENCOUNTER — Ambulatory Visit (HOSPITAL_COMMUNITY): Payer: Medicare HMO | Admitting: Physical Therapy

## 2019-06-07 DIAGNOSIS — M25662 Stiffness of left knee, not elsewhere classified: Secondary | ICD-10-CM | POA: Diagnosis not present

## 2019-06-07 DIAGNOSIS — M6281 Muscle weakness (generalized): Secondary | ICD-10-CM | POA: Diagnosis not present

## 2019-06-07 DIAGNOSIS — R262 Difficulty in walking, not elsewhere classified: Secondary | ICD-10-CM

## 2019-06-07 NOTE — Therapy (Signed)
[facility] [address] [place], [place], [address] Phone: [phone]   Fax:  [phone]  Physical Therapy Treatment and Discharge Note   Patient Details  Name: Michael Contreras MRN: [id number] Date of Birth: 14-Jan-1951 Referring Provider (PT): Rodell Perna  PHYSICAL THERAPY DISCHARGE SUMMARY  Visits from Start of Care: 10  Current functional level related to goals / functional outcomes: 2/3 STG and 2/2 LTG MET   Remaining deficits: Strength and ROM    Education / Equipment: See below  Plan: Patient agrees to discharge.  Patient goals were partially met. Patient is being discharged due to being pleased with the current functional level.  ?????       Encounter Date: 06/07/2019  PT End of Session - 06/07/19 1357    Visit Number  10    Number of Visits  18    Date for PT Re-Evaluation  07/02/19    Authorization Type  12 visits authorized    Authorization Time Period  POC dates 05/07/19 to 07/02/2019    Authorization - Number of Visits  12    Progress Note Due on Visit  19    PT Start Time  1400    PT Stop Time  1440    PT Time Calculation (min)  40 min    Activity Tolerance  Patient tolerated treatment well       Past Medical History:  Diagnosis Date  . Arthritis   . History of hiatal hernia    inguinal   . Prostate cancer [facility])     Past Surgical History:  Procedure Laterality Date  . APPENDECTOMY    . HERNIA REPAIR    . LYMPHADENECTOMY Bilateral 05/16/2016   Procedure: PELVIC LYMPHADENECTOMY;  Surgeon: Raynelle Bring, MD;  Location: [facility];  Service: Urology;  Laterality: Bilateral;  . ROBOT ASSISTED LAPAROSCOPIC RADICAL PROSTATECTOMY N/A 05/16/2016   Procedure: XI ROBOTIC ASSISTED LAPAROSCOPIC RADICAL PROSTATECTOMY LEVEL 2;  Surgeon: Raynelle Bring, MD;  Location: [facility];  Service: Urology;  Laterality: N/A;  . TOTAL KNEE ARTHROPLASTY Left 04/23/2019   Procedure: LEFT TOTAL KNEE ARTHROPLASTY-CEMENTED;  Surgeon: Marybelle Killings, MD;  Location: [place];  Service: Orthopedics;  Laterality: Left;    There were no vitals filed for this visit.  Subjective Assessment - 06/07/19 1404    Subjective  States he feels about 75% better reports not pain. MD happy with results    Pertinent History  prostate cancer    Limitations  Walking;Standing;Lifting;Sitting;House hold activities    Currently in Pain?  No/denies    Pain Onset  1 to 4 weeks ago         [facility] PT Assessment - 06/07/19 0001      Assessment   Medical Diagnosis  s/p left TKA    Referring Provider (PT)  Rodell Perna    Onset Date/Surgical Date  04/23/19                   [facility] Adult PT Treatment/Exercise - 06/07/19 0001      Knee/Hip Exercises: Standing   Lateral Step Up  10 reps;Hand Hold: 1;Step Height: 6";Both    Forward Step Up  15 reps;Hand Hold: 1;Step Height: 6"    Wall Squat  4 sets;5 reps;5 seconds    Other Standing Knee Exercises  split squats in // bars 5x3 B UE assist as needed on 4" step     Other Standing Knee Exercises  lateral stepping in mini squat  x5 lengths of 25 feet B              PT Education - 06/07/19 1519    Education Details  reiewed home program, answered all questions about current condition and need for continued elevation, rest breaks and not to return to full PLOF    Person(s) Educated  Patient    Methods  Explanation    Comprehension  Verbalized understanding       PT Short Term Goals - 06/07/19 1429      PT SHORT TERM GOAL #1   Title  Patient will be independent in HEP to improve functional outcomes    Time  4    Period  Weeks    Status  Achieved    Target Date  06/04/19      PT SHORT TERM GOAL #2   Title  Patient will demonstrate 0-120 degrees of left knee ROM to improve functional knee mobility.    Baseline  not met see objective section    Time  4    Period  Weeks    Status  Not Met    Target Date  06/04/19      PT SHORT TERM GOAL #3   Title  Patient will be able to ambulate  at least 2 minutes without an assistive device to demonstrate improved ambulatory mobility.    Time  4    Period  Weeks    Status  Achieved    Target Date  06/04/19        PT Long Term Goals - 06/02/19 1321      PT LONG TERM GOAL #1   Title  Patient will report at least 50% improvement in overall functional mobility.    Baseline  75% improvement    Time  8    Period  Weeks    Status  Achieved      PT LONG TERM GOAL #2   Title  Patient will be able to ascend and descend steps with reciprocal gait pattern and with use of railing as needed to improve ability to enter/leave his home.    Baseline  able to perform    Time  8    Period  Weeks    Status  Achieved            Plan - 06/07/19 1400    Clinical Impression Statement  Today present for last PT visit. He has made great improvements and has met all but TKE ROM goal. Answered all questions and advanced home program prior to end of session. Patient to discharge from physical therapy to home program secondary to progress made and independence in home exercise program.    Examination-Activity Limitations  Bend;Squat;Sit;Stand;Stairs;Transfers;Lift;Locomotion Level    Examination-Participation Restrictions  Cleaning;Driving;Yard Work;Community Activity    Stability/Clinical Decision Making  Stable/Uncomplicated    Rehab Potential  Excellent    PT Frequency  Other (comment)   3x/week for 2 weeks then 2x/week for 6 weeks   PT Duration  8 weeks    PT Treatment/Interventions  ADLs/Self Care Home Management;Aquatic Therapy;Cryotherapy;Electrical Stimulation;Traction;Balance training;Moist Heat;Therapeutic exercise;Therapeutic activities;Functional mobility training;Stair training;Gait training;Neuromuscular re-education;Patient/family education;Manual techniques;Dry needling;Passive range of motion;Scar mobilization;Joint Manipulations    PT Next Visit Plan  patient to discharge from PT to home exercise program    PT Home Exercise  Plan  3/12: calf stretch, AAROM knee flexion, knee extension stretch, LAQs, sit to stand; 3/15 - calf stretch on step, heel raise holds, TKE; 3/19 prone knee  bends (AROM and AAROM), TKE, hip extension, knee hangs. bridges, SAQs    Consulted and Agree with Plan of Care  Patient       Patient will benefit from skilled therapeutic intervention in order to improve the following deficits and impairments:  Abnormal gait, Pain, Difficulty walking, Decreased mobility, Decreased strength, Increased edema, Decreased skin integrity, Decreased endurance, Decreased range of motion, Decreased balance, Decreased activity tolerance  Visit Diagnosis: Decreased range of motion (ROM) of left knee  Muscle weakness (generalized)  Difficulty in walking, not elsewhere classified     Problem List Patient Active Problem List   Diagnosis Date Noted  . 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 Prostate cancer ([place]) 05/16/2016  . Abscess     3:20 PM, 06/07/19 Jerene Pitch, DPT Physical Therapy with [facility]  [phone] office  [place] [address] [place], [place], [address] Phone: [phone]   Fax:  [phone]  Name: Michael Contreras MRN: [id number] Date of Birth: 1950/12/16

## 2019-07-15 DIAGNOSIS — Z6825 Body mass index (BMI) 25.0-25.9, adult: Secondary | ICD-10-CM | POA: Diagnosis not present

## 2019-07-15 DIAGNOSIS — J42 Unspecified chronic bronchitis: Secondary | ICD-10-CM | POA: Diagnosis not present

## 2019-07-21 ENCOUNTER — Telehealth: Payer: Self-pay | Admitting: *Deleted

## 2019-07-21 NOTE — Telephone Encounter (Signed)
90 day Ortho bundle call completed. 

## 2019-09-27 DIAGNOSIS — R69 Illness, unspecified: Secondary | ICD-10-CM | POA: Diagnosis not present

## 2019-11-29 DIAGNOSIS — M79643 Pain in unspecified hand: Secondary | ICD-10-CM | POA: Diagnosis not present

## 2019-11-29 DIAGNOSIS — N529 Male erectile dysfunction, unspecified: Secondary | ICD-10-CM | POA: Diagnosis not present

## 2019-11-29 DIAGNOSIS — Z6825 Body mass index (BMI) 25.0-25.9, adult: Secondary | ICD-10-CM | POA: Diagnosis not present

## 2019-11-29 DIAGNOSIS — J42 Unspecified chronic bronchitis: Secondary | ICD-10-CM | POA: Diagnosis not present

## 2019-11-29 DIAGNOSIS — Z125 Encounter for screening for malignant neoplasm of prostate: Secondary | ICD-10-CM | POA: Diagnosis not present

## 2020-03-03 DIAGNOSIS — C61 Malignant neoplasm of prostate: Secondary | ICD-10-CM | POA: Diagnosis not present

## 2020-03-03 DIAGNOSIS — N5201 Erectile dysfunction due to arterial insufficiency: Secondary | ICD-10-CM | POA: Diagnosis not present

## 2020-03-09 DIAGNOSIS — Z23 Encounter for immunization: Secondary | ICD-10-CM | POA: Diagnosis not present

## 2020-04-20 DIAGNOSIS — M729 Fibroblastic disorder, unspecified: Secondary | ICD-10-CM | POA: Diagnosis not present

## 2020-04-20 DIAGNOSIS — M542 Cervicalgia: Secondary | ICD-10-CM | POA: Diagnosis not present

## 2020-04-20 DIAGNOSIS — Z6824 Body mass index (BMI) 24.0-24.9, adult: Secondary | ICD-10-CM | POA: Diagnosis not present

## 2020-04-21 ENCOUNTER — Telehealth: Payer: Self-pay | Admitting: *Deleted

## 2020-04-21 NOTE — Telephone Encounter (Signed)
Attempted 1 year call to patient; tried on home and cell numbers listed in chart- both state that the call cannot be completed at this time. Will try again.

## 2020-04-24 ENCOUNTER — Telehealth: Payer: Self-pay | Admitting: *Deleted

## 2020-04-24 NOTE — Telephone Encounter (Signed)
Ortho bundle 1 year calls completed; attempted x 2.

## 2020-09-11 DIAGNOSIS — Z6824 Body mass index (BMI) 24.0-24.9, adult: Secondary | ICD-10-CM | POA: Diagnosis not present

## 2020-09-11 DIAGNOSIS — M25511 Pain in right shoulder: Secondary | ICD-10-CM | POA: Diagnosis not present

## 2021-01-09 DIAGNOSIS — M25511 Pain in right shoulder: Secondary | ICD-10-CM | POA: Diagnosis not present

## 2021-01-09 DIAGNOSIS — Z6824 Body mass index (BMI) 24.0-24.9, adult: Secondary | ICD-10-CM | POA: Diagnosis not present

## 2021-01-09 DIAGNOSIS — G3184 Mild cognitive impairment, so stated: Secondary | ICD-10-CM | POA: Diagnosis not present

## 2021-01-09 DIAGNOSIS — E7849 Other hyperlipidemia: Secondary | ICD-10-CM | POA: Diagnosis not present

## 2021-01-09 DIAGNOSIS — E039 Hypothyroidism, unspecified: Secondary | ICD-10-CM | POA: Diagnosis not present

## 2021-03-26 DIAGNOSIS — H5213 Myopia, bilateral: Secondary | ICD-10-CM | POA: Diagnosis not present

## 2021-04-12 DIAGNOSIS — H43811 Vitreous degeneration, right eye: Secondary | ICD-10-CM | POA: Diagnosis not present

## 2021-04-12 DIAGNOSIS — Z01818 Encounter for other preprocedural examination: Secondary | ICD-10-CM | POA: Diagnosis not present

## 2021-04-12 DIAGNOSIS — H25812 Combined forms of age-related cataract, left eye: Secondary | ICD-10-CM | POA: Diagnosis not present

## 2021-04-23 DIAGNOSIS — H25812 Combined forms of age-related cataract, left eye: Secondary | ICD-10-CM | POA: Diagnosis not present

## 2021-05-07 DIAGNOSIS — H2511 Age-related nuclear cataract, right eye: Secondary | ICD-10-CM | POA: Diagnosis not present

## 2021-05-14 DIAGNOSIS — H04123 Dry eye syndrome of bilateral lacrimal glands: Secondary | ICD-10-CM | POA: Diagnosis not present

## 2021-08-23 DIAGNOSIS — S29011A Strain of muscle and tendon of front wall of thorax, initial encounter: Secondary | ICD-10-CM | POA: Diagnosis not present

## 2021-08-23 DIAGNOSIS — Z6824 Body mass index (BMI) 24.0-24.9, adult: Secondary | ICD-10-CM | POA: Diagnosis not present

## 2022-02-04 DIAGNOSIS — M1711 Unilateral primary osteoarthritis, right knee: Secondary | ICD-10-CM | POA: Diagnosis not present

## 2022-02-04 DIAGNOSIS — Z6825 Body mass index (BMI) 25.0-25.9, adult: Secondary | ICD-10-CM | POA: Diagnosis not present

## 2022-03-08 DIAGNOSIS — H5213 Myopia, bilateral: Secondary | ICD-10-CM | POA: Diagnosis not present

## 2022-04-09 DIAGNOSIS — H5203 Hypermetropia, bilateral: Secondary | ICD-10-CM | POA: Diagnosis not present

## 2022-07-01 DIAGNOSIS — R42 Dizziness and giddiness: Secondary | ICD-10-CM | POA: Diagnosis not present

## 2022-07-01 DIAGNOSIS — Z6825 Body mass index (BMI) 25.0-25.9, adult: Secondary | ICD-10-CM | POA: Diagnosis not present

## 2023-05-28 DIAGNOSIS — Z0000 Encounter for general adult medical examination without abnormal findings: Secondary | ICD-10-CM | POA: Diagnosis not present

## 2023-05-28 DIAGNOSIS — N400 Enlarged prostate without lower urinary tract symptoms: Secondary | ICD-10-CM | POA: Diagnosis not present

## 2023-05-28 DIAGNOSIS — G3184 Mild cognitive impairment, so stated: Secondary | ICD-10-CM | POA: Diagnosis not present

## 2023-05-28 DIAGNOSIS — Z125 Encounter for screening for malignant neoplasm of prostate: Secondary | ICD-10-CM | POA: Diagnosis not present

## 2023-05-28 DIAGNOSIS — Z6825 Body mass index (BMI) 25.0-25.9, adult: Secondary | ICD-10-CM | POA: Diagnosis not present

## 2023-05-28 DIAGNOSIS — E039 Hypothyroidism, unspecified: Secondary | ICD-10-CM | POA: Diagnosis not present

## 2023-05-28 DIAGNOSIS — R42 Dizziness and giddiness: Secondary | ICD-10-CM | POA: Diagnosis not present

## 2023-05-28 DIAGNOSIS — R7303 Prediabetes: Secondary | ICD-10-CM | POA: Diagnosis not present

## 2023-06-06 DIAGNOSIS — Z87891 Personal history of nicotine dependence: Secondary | ICD-10-CM | POA: Diagnosis not present

## 2023-06-06 DIAGNOSIS — Z136 Encounter for screening for cardiovascular disorders: Secondary | ICD-10-CM | POA: Diagnosis not present

## 2023-06-18 DIAGNOSIS — H524 Presbyopia: Secondary | ICD-10-CM | POA: Diagnosis not present

## 2023-07-23 DIAGNOSIS — Z13820 Encounter for screening for osteoporosis: Secondary | ICD-10-CM | POA: Diagnosis not present

## 2023-07-28 DIAGNOSIS — E039 Hypothyroidism, unspecified: Secondary | ICD-10-CM | POA: Diagnosis not present

## 2023-07-28 DIAGNOSIS — Z6825 Body mass index (BMI) 25.0-25.9, adult: Secondary | ICD-10-CM | POA: Diagnosis not present

## 2023-07-28 DIAGNOSIS — M542 Cervicalgia: Secondary | ICD-10-CM | POA: Diagnosis not present

## 2023-07-28 DIAGNOSIS — N182 Chronic kidney disease, stage 2 (mild): Secondary | ICD-10-CM | POA: Diagnosis not present

## 2023-07-28 DIAGNOSIS — N400 Enlarged prostate without lower urinary tract symptoms: Secondary | ICD-10-CM | POA: Diagnosis not present

## 2023-09-10 DIAGNOSIS — N182 Chronic kidney disease, stage 2 (mild): Secondary | ICD-10-CM | POA: Diagnosis not present

## 2023-09-10 DIAGNOSIS — Z6825 Body mass index (BMI) 25.0-25.9, adult: Secondary | ICD-10-CM | POA: Diagnosis not present

## 2023-09-10 DIAGNOSIS — E039 Hypothyroidism, unspecified: Secondary | ICD-10-CM | POA: Diagnosis not present

## 2023-09-10 DIAGNOSIS — N400 Enlarged prostate without lower urinary tract symptoms: Secondary | ICD-10-CM | POA: Diagnosis not present

## 2023-09-10 DIAGNOSIS — M542 Cervicalgia: Secondary | ICD-10-CM | POA: Diagnosis not present

## 2023-09-10 DIAGNOSIS — R5383 Other fatigue: Secondary | ICD-10-CM | POA: Diagnosis not present

## 2023-09-17 DIAGNOSIS — Z6825 Body mass index (BMI) 25.0-25.9, adult: Secondary | ICD-10-CM | POA: Diagnosis not present

## 2023-09-17 DIAGNOSIS — L723 Sebaceous cyst: Secondary | ICD-10-CM | POA: Diagnosis not present

## 2023-12-08 DIAGNOSIS — N182 Chronic kidney disease, stage 2 (mild): Secondary | ICD-10-CM | POA: Diagnosis not present

## 2023-12-08 DIAGNOSIS — N400 Enlarged prostate without lower urinary tract symptoms: Secondary | ICD-10-CM | POA: Diagnosis not present

## 2024-01-06 DIAGNOSIS — N182 Chronic kidney disease, stage 2 (mild): Secondary | ICD-10-CM | POA: Diagnosis not present

## 2024-01-06 DIAGNOSIS — N400 Enlarged prostate without lower urinary tract symptoms: Secondary | ICD-10-CM | POA: Diagnosis not present
